# Patient Record
Sex: Female | Born: 1995 | Race: Black or African American | Hispanic: No | Marital: Married | Smoking: Never smoker
Health system: Southern US, Community
[De-identification: ages and names within clinical notes are randomized; demographics above are authoritative.]

## PROBLEM LIST (undated history)

## (undated) ENCOUNTER — Inpatient Hospital Stay (HOSPITAL_COMMUNITY): Payer: Self-pay

## (undated) DIAGNOSIS — Z789 Other specified health status: Secondary | ICD-10-CM

## (undated) HISTORY — PX: NO PAST SURGERIES: SHX2092

---
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
## 2015-06-18 ENCOUNTER — Encounter (HOSPITAL_COMMUNITY): Payer: Self-pay | Admitting: *Deleted

## 2015-06-28 ENCOUNTER — Emergency Department (HOSPITAL_COMMUNITY)
Admission: EM | Admit: 2015-06-28 | Discharge: 2015-06-28 | Disposition: A | Discharge status: Home / Self Care | Payer: Medicaid Other | Attending: Physician Assistant | Admitting: Physician Assistant

## 2015-06-28 ENCOUNTER — Encounter (HOSPITAL_COMMUNITY): Payer: Self-pay | Admitting: *Deleted

## 2015-06-28 DIAGNOSIS — R251 Tremor, unspecified: Secondary | ICD-10-CM | POA: Insufficient documentation

## 2015-06-28 DIAGNOSIS — Z3202 Encounter for pregnancy test, result negative: Secondary | ICD-10-CM | POA: Diagnosis not present

## 2015-06-28 DIAGNOSIS — R51 Headache: Secondary | ICD-10-CM | POA: Diagnosis present

## 2015-06-28 DIAGNOSIS — R519 Headache, unspecified: Secondary | ICD-10-CM

## 2015-06-28 DIAGNOSIS — R42 Dizziness and giddiness: Secondary | ICD-10-CM | POA: Diagnosis not present

## 2015-06-28 DIAGNOSIS — R5383 Other fatigue: Secondary | ICD-10-CM | POA: Insufficient documentation

## 2015-06-28 DIAGNOSIS — Z79899 Other long term (current) drug therapy: Secondary | ICD-10-CM | POA: Insufficient documentation

## 2015-06-28 DIAGNOSIS — R112 Nausea with vomiting, unspecified: Secondary | ICD-10-CM | POA: Diagnosis not present

## 2015-06-28 LAB — I-STAT BETA HCG BLOOD, ED (MC, WL, AP ONLY)

## 2015-06-28 LAB — COMPREHENSIVE METABOLIC PANEL
ALT: 15 U/L (ref 14–54)
AST: 22 U/L (ref 15–41)
Albumin: 3.9 g/dL (ref 3.5–5.0)
Alkaline Phosphatase: 76 U/L (ref 38–126)
Anion gap: 9 (ref 5–15)
BUN: 15 mg/dL (ref 6–20)
CALCIUM: 9.5 mg/dL (ref 8.9–10.3)
CO2: 25 mmol/L (ref 22–32)
CREATININE: 0.68 mg/dL (ref 0.44–1.00)
Chloride: 104 mmol/L (ref 101–111)
GFR calc non Af Amer: >60 mL/min (ref >60)
Glucose, Bld: 96 mg/dL (ref 65–99)
Potassium: 4 mmol/L (ref 3.5–5.1)
SODIUM: 138 mmol/L (ref 135–145)
Total Bilirubin: 0.6 mg/dL (ref 0.3–1.2)
Total Protein: 7.3 g/dL (ref 6.5–8.1)

## 2015-06-28 LAB — CBC
HCT: 33.1 % — ABNORMAL LOW (ref 36.0–46.0)
Hemoglobin: 12 g/dL (ref 12.0–15.0)
MCH: 25.9 pg — AB (ref 26.0–34.0)
MCHC: 36.3 g/dL — AB (ref 30.0–36.0)
MCV: 71.5 fL — ABNORMAL LOW (ref 78.0–100.0)
Platelets: 239 10*3/uL (ref 150–400)
RBC: 4.63 MIL/uL (ref 3.87–5.11)
RDW: 16.7 % — ABNORMAL HIGH (ref 11.5–15.5)
WBC: 5.9 10*3/uL (ref 4.0–10.5)

## 2015-06-28 LAB — URINALYSIS, ROUTINE W REFLEX MICROSCOPIC
BILIRUBIN URINE: NEGATIVE
Glucose, UA: NEGATIVE mg/dL
HGB URINE DIPSTICK: NEGATIVE
Ketones, ur: NEGATIVE mg/dL
Leukocytes, UA: NEGATIVE
Nitrite: NEGATIVE
PROTEIN: NEGATIVE mg/dL
Specific Gravity, Urine: 1.025 (ref 1.005–1.030)
pH: 5.5 (ref 5.0–8.0)

## 2015-06-28 LAB — LIPASE, BLOOD: Lipase: 47 U/L (ref 11–51)

## 2015-06-28 NOTE — ED Notes (Signed)
Pt reports fatigue, headache and n/v x 4 days. Denies bodyaches, fever or diarrhea. No acute distress noted at triage.

## 2015-06-28 NOTE — ED Provider Notes (Signed)
CSN: [id number]     Arrival date & time 06/28/15  1411 History  By signing my name below, I, Freida Busmaniana Omoyeni, attest that this documentation has been prepared under the direction and in the presence of non-physician practitioner, Joycie PeekBenjamin Kadyn Guild, PA-C. Electronically Signed: Freida Busmaniana Omoyeni, Scribe. 06/28/2015. 5:09 PM.    Chief Complaint  Patient presents with  . Headache  . Emesis   The history is provided by the patient. No language interpreter was used.    HPI Comments:  Dominique Tanner is a 20 y.o. female who presents to the Emergency Department complaining of  Intermittent dizziness and fatigue x 4 days. She reports associated nausea and vomiting. Pt states yesterday after breastfeeding she suddenly felt very hungry, shaky and states she felt better after eating. Pt also reports sharp intermittent HA. She notes h/o similar HA while pregnant . No alleviating factors noted; no treatments tried. G0P1. No fevers, chest pain, shortness of breath, numbness or weakness, urinary symptoms, rash. Denies any other medical symptoms or problems.  Past Medical History  Diagnosis Date  . Medical history non-contributory    Past Surgical History  Procedure Laterality Date  . No past surgeries     History reviewed. No pertinent family history. Social History  Substance Use Topics  . Smoking status: Never Smoker   . Smokeless tobacco: None  . Alcohol Use: No   OB History    Gravida Para Term Preterm AB TAB SAB Ectopic Multiple Living   1              Review of Systems  10 systems reviewed and all are negative for acute change except as noted in the HPI.   Allergies  Kiwi extract  Home Medications   Prior to Admission medications   Medication Sig Start Date End Date Taking? Authorizing Provider  Prenatal Vit-Fe Fumarate-FA (PRENATAL MULTIVITAMIN) TABS tablet Take 1 tablet by mouth daily at 12 noon.    Historical Provider, MD   BP 107/59 mmHg  Pulse 85  Temp(Src) 98.4 F (36.9 C) (Oral)   Resp 18  SpO2 97% Physical Exam  Constitutional: She is oriented to person, place, and time. She appears well-developed and well-nourished. No distress.  HENT:  Head: Normocephalic and atraumatic.  Mouth/Throat: Oropharynx is clear and moist.  Eyes: Conjunctivae are normal. Pupils are equal, round, and reactive to light. Right eye exhibits no discharge. Left eye exhibits no discharge. No scleral icterus.  Neck: Normal range of motion. Neck supple.  No meningismus or nuchal rigidity  Cardiovascular: Normal rate, regular rhythm and normal heart sounds.   Pulmonary/Chest: Effort normal and breath sounds normal. No respiratory distress. She has no wheezes. She has no rales.  Abdominal: Soft. She exhibits no distension. There is no tenderness.  Musculoskeletal: She exhibits no tenderness.  Neurological: She is alert and oriented to person, place, and time.  Cranial Nerves II-XII grossly intact. Motor strength and sensation are baseline. Moves all extremities without ataxia. Gait is baseline. Completes fine motor coordination movements without difficulty.  Skin: Skin is warm and dry. No rash noted.  Psychiatric: She has a normal mood and affect.  Nursing note and vitals reviewed.   ED Course  Procedures   DIAGNOSTIC STUDIES:  Oxygen Saturation is 97% on RA, normal by my interpretation.    COORDINATION OF CARE:  5:07 PM Pt offered pain meds at this time which she declined. Discussed treatment plan with pt at bedside and pt agreed to plan.  Labs Review Labs Reviewed  CBC - Abnormal; Notable for the following:    HCT 33.1 (*)    MCV 71.5 (*)    MCH 25.9 (*)    MCHC 36.3 (*)    RDW 16.7 (*)    All other components within normal limits  URINALYSIS, ROUTINE W REFLEX MICROSCOPIC (NOT AT [facility]) - Abnormal; Notable for the following:    APPearance CLOUDY (*)    All other components within normal limits  LIPASE, BLOOD  COMPREHENSIVE METABOLIC PANEL  I-STAT BETA HCG BLOOD, ED ([facility], [facility], [facility]  ONLY)    Imaging Review No results found. I have personally reviewed and evaluated these images and lab results as part of my medical decision-making.  Filed Vitals:   06/28/15 1426  BP: 107/59  Pulse: 85  Temp: 98.4 F (36.9 C)  TempSrc: Oral  Resp: 18  SpO2: 97%    MDM  Dominique Tanner is a 20 y.o. female who recently gave birth 3 months ago, currently breast-feeding comes in for evaluation of fatigue, headache and nausea. Patient reports she had fleeting episodes of nausea, shakiness, headache that resolved with eating. Suspect symptoms are due to Intermittent hypoglycemia as they immediately resolve after eating. She appears very well in the emergency Department, hemodynamically stable and afebrile. Completely benign physical exam. She declines any medicines or other interventional therapies. Discussed continued oral rehydration, small snacks throughout the day. Discussed strict return precautions. She verbalizes understanding and agrees with this plan and subsequent discharge. Final diagnoses:  Nonintractable headache, unspecified chronicity pattern, unspecified headache type  Other fatigue    I personally performed the services described in this documentation, which was scribed in my presence. The recorded information has been reviewed and is accurate.    Joycie Peek, PA-C 06/28/15 1915  Courteney Randall An, MD 06/29/15 1610

## 2015-06-28 NOTE — Discharge Instructions (Signed)
There does not appear to be an emergent cause for her symptoms at this time. Your exam, labs and urine are all reassuring. It is important for you to stay well hydrated, eat plenty snacks to ensure your blood sugar does not drop too low as this can be a source for your symptoms. Follow-up with your doctor as needed. Return to ED for any new or worsening symptoms.  Fatigue Fatigue is feeling tired all of the time, a lack of energy, or a lack of motivation. Occasional or mild fatigue is often a normal response to activity or life in general. However, long-lasting (chronic) or extreme fatigue may indicate an underlying medical condition. HOME CARE INSTRUCTIONS  Watch your fatigue for any changes. The following actions may help to lessen any discomfort you are feeling:  Talk to your health care provider about how much sleep you need each night. Try to get the required amount every night.  Take medicines only as directed by your health care provider.  Eat a healthy and nutritious diet. Ask your health care provider if you need help changing your diet.  Drink enough fluid to keep your urine clear or pale yellow.  Practice ways of relaxing, such as yoga, meditation, massage therapy, or acupuncture.  Exercise regularly.   Change situations that cause you stress. Try to keep your work and personal routine reasonable.  Do not abuse illegal drugs.  Limit alcohol intake to no more than 1 drink per day for nonpregnant women and 2 drinks per day for men. One drink equals 12 ounces of beer, 5 ounces of wine, or 1 ounces of hard liquor.  Take a multivitamin, if directed by your health care provider. SEEK MEDICAL CARE IF:   Your fatigue does not get better.  You have a fever.   You have unintentional weight loss or gain.  You have headaches.   You have difficulty:   Falling asleep.  Sleeping throughout the night.  You feel angry, guilty, anxious, or sad.   You are unable to have a  bowel movement (constipation).   You skin is dry.   Your legs or another part of your body is swollen.  SEEK IMMEDIATE MEDICAL CARE IF:   You feel confused.   Your vision is blurry.  You feel faint or pass out.   You have a severe headache.   You have severe abdominal, pelvic, or back pain.   You have chest pain, shortness of breath, or an irregular or fast heartbeat.   You are unable to urinate or you urinate less than normal.   You develop abnormal bleeding, such as bleeding from the rectum, vagina, nose, lungs, or nipples.  You vomit blood.   You have thoughts about harming yourself or committing suicide.   You are worried that you might harm someone else.    This information is not intended to replace advice given to you by your health care provider. Make sure you discuss any questions you have with your health care provider.   Document Released: 01/18/2007 Document Revised: 04/13/2014 Document Reviewed: 07/25/2013 Elsevier Interactive Patient Education [employer]2016 Elsevier Inc.

## 2015-08-09 ENCOUNTER — Emergency Department (HOSPITAL_COMMUNITY)
Admission: EM | Admit: 2015-08-09 | Discharge: 2015-08-09 | Disposition: A | Discharge status: Home / Self Care | Payer: Medicaid Other | Attending: Emergency Medicine | Admitting: Emergency Medicine

## 2015-08-09 ENCOUNTER — Encounter (HOSPITAL_COMMUNITY): Payer: Self-pay | Admitting: Family Medicine

## 2015-08-09 ENCOUNTER — Emergency Department (HOSPITAL_COMMUNITY): Payer: Medicaid Other

## 2015-08-09 DIAGNOSIS — R102 Pelvic and perineal pain: Secondary | ICD-10-CM

## 2015-08-09 DIAGNOSIS — N760 Acute vaginitis: Secondary | ICD-10-CM | POA: Diagnosis not present

## 2015-08-09 DIAGNOSIS — R1013 Epigastric pain: Secondary | ICD-10-CM | POA: Diagnosis present

## 2015-08-09 DIAGNOSIS — R112 Nausea with vomiting, unspecified: Secondary | ICD-10-CM | POA: Insufficient documentation

## 2015-08-09 DIAGNOSIS — Z3202 Encounter for pregnancy test, result negative: Secondary | ICD-10-CM | POA: Insufficient documentation

## 2015-08-09 DIAGNOSIS — R1030 Lower abdominal pain, unspecified: Secondary | ICD-10-CM

## 2015-08-09 DIAGNOSIS — B9689 Other specified bacterial agents as the cause of diseases classified elsewhere: Secondary | ICD-10-CM

## 2015-08-09 LAB — URINALYSIS, ROUTINE W REFLEX MICROSCOPIC
Bilirubin Urine: NEGATIVE
GLUCOSE, UA: NEGATIVE mg/dL
HGB URINE DIPSTICK: NEGATIVE
Ketones, ur: NEGATIVE mg/dL
Leukocytes, UA: NEGATIVE
Nitrite: NEGATIVE
PROTEIN: NEGATIVE mg/dL
Specific Gravity, Urine: 1.028 (ref 1.005–1.030)
pH: 6 (ref 5.0–8.0)

## 2015-08-09 LAB — CBC
HCT: 34.9 % — ABNORMAL LOW (ref 36.0–46.0)
Hemoglobin: 12.4 g/dL (ref 12.0–15.0)
MCH: 26.3 pg (ref 26.0–34.0)
MCHC: 35.5 g/dL (ref 30.0–36.0)
MCV: 73.9 fL — AB (ref 78.0–100.0)
PLATELETS: 210 10*3/uL (ref 150–400)
RBC: 4.72 MIL/uL (ref 3.87–5.11)
RDW: 14.8 % (ref 11.5–15.5)
WBC: 5.3 10*3/uL (ref 4.0–10.5)

## 2015-08-09 LAB — COMPREHENSIVE METABOLIC PANEL
ALK PHOS: 82 U/L (ref 38–126)
ALT: 17 U/L (ref 14–54)
AST: 18 U/L (ref 15–41)
Albumin: 4 g/dL (ref 3.5–5.0)
Anion gap: 9 (ref 5–15)
BUN: 10 mg/dL (ref 6–20)
CALCIUM: 9.5 mg/dL (ref 8.9–10.3)
CHLORIDE: 103 mmol/L (ref 101–111)
CO2: 26 mmol/L (ref 22–32)
CREATININE: 0.62 mg/dL (ref 0.44–1.00)
GFR calc Af Amer: >60 mL/min (ref >60)
Glucose, Bld: 82 mg/dL (ref 65–99)
Potassium: 4 mmol/L (ref 3.5–5.1)
Sodium: 138 mmol/L (ref 135–145)
Total Bilirubin: 1.4 mg/dL — ABNORMAL HIGH (ref 0.3–1.2)
Total Protein: 7.4 g/dL (ref 6.5–8.1)

## 2015-08-09 LAB — I-STAT BETA HCG BLOOD, ED (MC, WL, AP ONLY)

## 2015-08-09 LAB — WET PREP, GENITAL
Sperm: NONE SEEN
Trich, Wet Prep: NONE SEEN
YEAST WET PREP: NONE SEEN

## 2015-08-09 LAB — LIPASE, BLOOD: LIPASE: 35 U/L (ref 11–51)

## 2015-08-09 MED ORDER — DOXYCYCLINE HYCLATE 100 MG PO CAPS: 100.0000 mg | ORAL_CAPSULE | Freq: Two times a day (BID) | ORAL | Status: DC

## 2015-08-09 MED ORDER — ONDANSETRON HCL 4 MG/2ML IJ SOLN
4.0000 mg | Freq: Once | INTRAMUSCULAR | Status: AC
  Administered 2015-08-09: 4 mg via INTRAVENOUS
  Filled 2015-08-09: qty 2

## 2015-08-09 MED ORDER — METRONIDAZOLE 500 MG PO TABS: 500.0000 mg | ORAL_TABLET | Freq: Two times a day (BID) | ORAL | Status: DC

## 2015-08-09 MED ORDER — SODIUM CHLORIDE 0.9 % IV BOLUS (SEPSIS)
1000.0000 mL | Freq: Once | INTRAVENOUS | Status: AC
  Administered 2015-08-09: 1000 mL via INTRAVENOUS

## 2015-08-09 MED ORDER — CEFTRIAXONE SODIUM 250 MG IJ SOLR
250.0000 mg | Freq: Once | INTRAMUSCULAR | Status: AC
  Administered 2015-08-09: 250 mg via INTRAMUSCULAR
  Filled 2015-08-09: qty 250

## 2015-08-09 MED ORDER — IOPAMIDOL (ISOVUE-300) INJECTION 61%
INTRAVENOUS | Status: DC
Start: 2015-08-09 — End: 2015-08-09
  Filled 2015-08-09: qty 100

## 2015-08-09 MED ORDER — LIDOCAINE HCL (PF) 1 % IJ SOLN
INTRAMUSCULAR | Status: AC
  Administered 2015-08-09: 1 mL
  Filled 2015-08-09: qty 5

## 2015-08-09 MED ORDER — MORPHINE SULFATE (PF) 4 MG/ML IV SOLN
4.0000 mg | Freq: Once | INTRAVENOUS | Status: AC
  Administered 2015-08-09: 4 mg via INTRAVENOUS
  Filled 2015-08-09: qty 1

## 2015-08-09 MED ORDER — AZITHROMYCIN 250 MG PO TABS
1000.0000 mg | ORAL_TABLET | Freq: Once | ORAL | Status: AC
  Administered 2015-08-09: 1000 mg via ORAL
  Filled 2015-08-09: qty 4

## 2015-08-09 NOTE — ED Provider Notes (Signed)
3:38 PM Patient signed out to me by Ander Slade, PA-C.  Pending pelvic US.  If negative DC to home.  Plan for treatment of PID.  No elevated LFTs.  Patient is not septic.  VSS.  Non-toxic appearing.  4:21 PM [place] as below. No emergent findings.  Patient DC'd home with doxy and flagyl.  Patient instructed to use formula while on abx.  She will need to pump and discard milk.  Discussed this with the patient.  Results for orders placed or performed during the hospital encounter of 08/09/15  Wet prep, genital  Result Value Ref Range   Yeast Wet Prep HPF POC NONE SEEN NONE SEEN   Trich, Wet Prep NONE SEEN NONE SEEN   Clue Cells Wet Prep HPF POC PRESENT (A) NONE SEEN   WBC, Wet Prep HPF POC MANY (A) NONE SEEN   Sperm NONE SEEN   Lipase, blood  Result Value Ref Range   Lipase 35 11 - 51 U/L  Comprehensive metabolic panel  Result Value Ref Range   Sodium 138 135 - 145 mmol/L   Potassium 4.0 3.5 - 5.1 mmol/L   Chloride 103 101 - 111 mmol/L   CO2 26 22 - 32 mmol/L   Glucose, Bld 82 65 - 99 mg/dL   BUN 10 6 - 20 mg/dL   Creatinine, Ser 4.09 0.44 - 1.00 mg/dL   Calcium 9.5 8.9 - 81.1 mg/dL   Total Protein 7.4 6.5 - 8.1 g/dL   Albumin 4.0 3.5 - 5.0 g/dL   AST 18 15 - 41 U/L   ALT 17 14 - 54 U/L   Alkaline Phosphatase 82 38 - 126 U/L   Total Bilirubin 1.4 (H) 0.3 - 1.2 mg/dL   GFR calc non Af Amer >60 >60 mL/min   GFR calc Af Amer >60 >60 mL/min   Anion gap 9 5 - 15  CBC  Result Value Ref Range   WBC 5.3 4.0 - 10.5 K/uL   RBC 4.72 3.87 - 5.11 MIL/uL   Hemoglobin 12.4 12.0 - 15.0 g/dL   HCT 91.4 (L) 78.2 - 95.6 %   MCV 73.9 (L) 78.0 - 100.0 fL   MCH 26.3 26.0 - 34.0 pg   MCHC 35.5 30.0 - 36.0 g/dL   RDW 21.3 08.6 - 57.8 %   Platelets 210 150 - 400 K/uL  Urinalysis, Routine w reflex microscopic  Result Value Ref Range   Color, Urine YELLOW YELLOW   APPearance CLEAR CLEAR   Specific Gravity, Urine 1.028 1.005 - 1.030   pH 6.0 5.0 - 8.0   Glucose, UA NEGATIVE NEGATIVE mg/dL   Hgb urine  dipstick NEGATIVE NEGATIVE   Bilirubin Urine NEGATIVE NEGATIVE   Ketones, ur NEGATIVE NEGATIVE mg/dL   Protein, ur NEGATIVE NEGATIVE mg/dL   Nitrite NEGATIVE NEGATIVE   Leukocytes, UA NEGATIVE NEGATIVE  I-Stat beta hCG blood, ED  Result Value Ref Range   I-stat hCG, quantitative <5.0 <5 mIU/mL   Comment 3           US Transvaginal Non-ob  08/09/2015  CLINICAL DATA:  Patient with pelvic pain. EXAM: TRANSABDOMINAL AND TRANSVAGINAL ULTRASOUND OF PELVIS TECHNIQUE: Both transabdominal and transvaginal ultrasound examinations of the pelvis were performed. Transabdominal technique was performed for global imaging of the pelvis including uterus, ovaries, adnexal regions, and pelvic cul-de-sac. It was necessary to proceed with endovaginal exam following the transabdominal exam to visualize the endometrium and adnexal structures. COMPARISON:  CT abdomen pelvis 08/09/2015 FINDINGS: Uterus Measurements: 8.9 x  3.6 x 6.3 cm. No fibroids or other mass visualized. Endometrium Thickness: 7 mm.  No focal abnormality visualized. Right ovary Measurements: 2.7 x 1.5 x 2.7 cm. Normal appearance/no adnexal mass. Left ovary Measurements: 4.3 x 2.4 x 2.0 cm. Normal appearance/no adnexal mass. Other findings Small amount of free fluid. IMPRESSION: Small amount of pelvic free fluid, likely physiologic. Normal sonographic appearance of the uterus and ovaries. Electronically Signed   By: Annia Beltrew  Davis M.D.   On: 08/09/2015 16:10   [place] Pelvis Complete  08/09/2015  CLINICAL DATA:  Patient with pelvic pain. EXAM: TRANSABDOMINAL AND TRANSVAGINAL ULTRASOUND OF PELVIS TECHNIQUE: Both transabdominal and transvaginal ultrasound examinations of the pelvis were performed. Transabdominal technique was performed for global imaging of the pelvis including uterus, ovaries, adnexal regions, and pelvic cul-de-sac. It was necessary to proceed with endovaginal exam following the transabdominal exam to visualize the endometrium and adnexal structures.  COMPARISON:  CT abdomen pelvis 08/09/2015 FINDINGS: Uterus Measurements: 8.9 x 3.6 x 6.3 cm. No fibroids or other mass visualized. Endometrium Thickness: 7 mm.  No focal abnormality visualized. Right ovary Measurements: 2.7 x 1.5 x 2.7 cm. Normal appearance/no adnexal mass. Left ovary Measurements: 4.3 x 2.4 x 2.0 cm. Normal appearance/no adnexal mass. Other findings Small amount of free fluid. IMPRESSION: Small amount of pelvic free fluid, likely physiologic. Normal sonographic appearance of the uterus and ovaries. Electronically Signed   By: Annia Beltrew  Davis M.D.   On: 08/09/2015 16:10   Ct Abdomen Pelvis W Contrast  08/09/2015  CLINICAL DATA:  Right upper quadrant abdominal pain beginning yesterday. Abdominal pain and tenderness. Left lower quadrant pain. EXAM: CT ABDOMEN AND PELVIS WITH CONTRAST TECHNIQUE: Multidetector CT imaging of the abdomen and pelvis was performed using the standard protocol following bolus administration of intravenous contrast. CONTRAST:  100 mL Isovue-300 COMPARISON:  None. FINDINGS: Lower chest: The lung bases are clear without focal nodule, mass, or airspace disease. Heart size is normal. No significant pleural or pericardial effusion is present. Hepatobiliary: The liver is within normal limits. The common bile duct and gallbladder are unremarkable. Pancreas: Within normal limits. Spleen: Unremarkable. Adrenals/Urinary Tract: The adrenal glands are normal bilaterally. The kidneys and ureters are within normal limits. The urinary bladder is normal. Stomach/Bowel: The stomach and duodenum are within normal limits. The small bowel is within normal limits. The appendix is visualized and normal. The cecum and ascending colon are normal. The transverse colon is within normal limits. The descending and rectosigmoid colon are within normal limits. Vascular/Lymphatic: No significant adenopathy is present. No focal vascular lesions are present. Reproductive: The uterus is moderately edematous.  There is fluid within the endometrial canal. Moderate free fluid surrounds the uterus and layers dependently within the anatomic pelvis. The adnexa are within normal limits bilaterally. Other: A small paraumbilical hernia contains fat but no bowel. Musculoskeletal: No focal lytic or blastic lesions are present. Vertebral body heights and alignment are maintained. IMPRESSION: 1. No acute or focal lesion to explain upper abdominal pain or vomiting. 2. Moderate edema surrounding the uterus with fluid in the endometrial canal. This is nonspecific and could be related to the patient's menstrual cycle. Pelvic infection is not excluded. 3. Layering free fluid within the anatomic pelvis may be physiologic. Electronically Signed   By: Marin Robertshristopher  Mattern M.D.   On: 08/09/2015 13:31      Roxy Horsemanobert Trinidee Schrag, PA-C 08/09/15 1622

## 2015-08-09 NOTE — Discharge Instructions (Signed)
You have been seen today for abdominal pain. You still have labs pending. Any abnormal results will be called to you if they are abnormal. 1. Your wet prep showed evidence of bacterial vaginosis (BV). This is due to the overgrowth of bacteria and vagina and is not sexually transmitted disease. Your imaging showed evidence of fluid in the pelvis and, combined with the findings on the pelvic exam, give evidence for a possible pelvic infection, such as Pelvic Inflammatory Disease (PID). One of the reasons for PID is sexual transmitted infections, but this is not the only way PID can occur. Refer to the attached literature for more information. 2. Please take all of your antibiotics until finished!   You may develop abdominal discomfort or diarrhea from the antibiotic.  You may help offset this with probiotics which you can buy or get in yogurt. Do not eat or take the probiotics until 2 hours after your antibiotic. These antibiotics, Doxycycline and Flagyl, are the necessary medications needed to treat PID and Bacterial Vaginosis, respectively. You should refrain from breastfeeding while you are taking these antibiotics. You may resume breast feeding after following the complete course.  3. You should follow up with OBGYN as soon as possible. Call the number provided or go to the clinic to set  up an appointment. Follow up with PCP as needed. Return to ED should symptoms worsen.    RESOURCE GUIDE  Chronic Pain Problems: Contact [facility]  [phone] Patients need to be referred by their primary care doctor.  Insufficient Money for Medicine: Contact United Way:  call "211" or [facility] [phone].  No Primary Care Doctor: - Call [facility]  [phone] - can help you locate a primary care doctor that  accepts your insurance, provides certain services, etc. - Physician Referral [phone]  Agencies that provide inexpensive medical care: - [facility]  [phone] - [facility]  [phone] - [facility]  [phone] [facility]  [phone] - [facility]  [phone] [facility]  [phone]  Medicaid-accepting [facility] Providers: - [facility]- [address]  [phone], Mon-Fri 9am-7pm, Sat 9am-1pm - [facility]- [address] [place], [address] [place]  [phone] - [facility]- [address] [place]  [phone] [facility]- [address]  [phone] - Renaye Rakers- [address] [place], [address], [phone]  Only accepts [place] patients after they have their name  applied to their card  Self Pay (no insurance) in [place]: - Sickle Cell Patients: Dr Willey Blade, [facility]  [address] [place], [phone] - [facility]- [address] [place]  [phone]       [facility] [place]- [address], [address]       -     [facility]- see information above (Speak to [employer] if you do not have insurance)       -  [facility]- [address] [place], [phone]       -  [facility]- [address],  [phone]       -  [facility]- [address], [phone]       -  Dr Julio Sicks-  [address], [place], [phone]       -  [facility]- [address] [phone]       -  [facility]- [address], [phone], also [address], [phone]       -    [facility]- [address] [place], [phone], 1st & 3rd Saturday   every month, 10am-1pm  1) Find a Doctor and Pay Out of Pocket Although you won't have to find out who is covered by your insurance plan, it is a good idea to ask around and get recommendations. You will then need to call the office and see if the doctor you have chosen will accept you as a new patient and what types of  options they offer for patients who are self-pay. Some doctors offer discounts or will set up payment plans for their patients who do not have insurance, but you will need to ask so you aren't surprised when you get to your appointment.  2) Contact Your Local Health Department Not all health departments have doctors that can see patients for sick visits, but many do, so it is worth a call to see if yours does. If you don't know where your local health department is, you can check in your phone book. The CDC also has a tool to help you locate your state's health department, and many state websites also have listings of all of their local health departments.  3) Find a Walk-in Clinic If your illness is not likely to be very severe or complicated, you may want to try a walk in clinic. These are popping up all over the country in pharmacies, drugstores, and shopping centers. They're usually staffed by nurse practitioners or physician assistants that have been trained to treat common illnesses and complaints. They're usually fairly quick and inexpensive. However, if you have serious medical issues or chronic medical problems, these are probably not your best option  STD Testing - [facility] Department of [facility], STD Clinic, [address], [place], phone [phone] or [phone].  Monday - Friday, call for an appointment. [facility] Department of [employer], STD Clinic, [address], [place], phone [phone] or [phone].  Monday - Friday, call for an appointment.  Abuse/Neglect: [facility] Child Abuse Hotline 747-761-0364 [facility] Child Abuse Hotline (817)651-1574 (After Hours)  Emergency Shelter:  [facility] 346-212-3727  Maternity Homes: - Room at the [facility] 2896159846 - [facility] [phone]  MRSA Hotline #:   229-832-6729  [facility] Resources  Free Clinic  of [place]  [employer] [facility] Dept. [address]                 [address]         [address] [place] [address]  [place]                                               Cristobal Goldmann Phone:  [phone]  Phone:  [phone]                   Phone:  [phone]  [facility], [phone] - [facility] - [phone]- [phone]       -     [facility] in [place], [address],                                  [phone], [facility]  [place] Child Abuse Hotline 321-082-8136(336) (403) 118-4966 or 765-753-5607(336) (610) [phone] (After Hours)   Behavioral Health Services  Substance Abuse Resources: - [facility] Drug [facility]  [phone] - [facility] [phone] - [facility] [phone] Floydene Flock[facility] [phone] - Residential & Outpatient Substance Abuse Program  [phone]  Psychological Services: Tressie Ellis- [facility]  [phone] [facility]  [phone] - [facility], [phone] [place] [address], [place], ACCESS LINE: 224-752-67091-(715)[phone] or (939)[phone], [email or web address]  Dental Assistance  If unable to pay or uninsured, contact:  [facility] or [facility] to become qualified for the adult dental clinic.  Patients with Medicaid: [facility] [phone], [phone] [address], [phone]  If unable to pay, or uninsured, contact [facility] [phone]) or [facility] [phone] in [place], [phone] in [facility]) to become qualified for the adult dental clinic   Other Low-Cost Community Dental Services: - [facility]- [address], [place], [place], [phone], [phone], [address]. [address], 2nd and 4th Thursday of the month at 6:30am.  10 clients each day by appointment, can sometimes see  walk-in patients if someone does not show for an appointment. [facility]- [address], [place], [place], [phone], [phone] - [facility]- [address], [place], [place], [id number], [phone] - [facility]- [phone] [facility]- [facility]- [phone] [facility]- [facility]- [phone]

## 2015-08-09 NOTE — ED Notes (Signed)
Patient stable and ambulatory. Patient verbalized understanding of the discharge instructions.   

## 2015-08-09 NOTE — ED Provider Notes (Signed)
CSN: [id number]     Arrival date & time 08/09/15  1019 History   First MD Initiated Contact with Patient 08/09/15 1146     Chief Complaint  Patient presents with  . Abdominal Pain     (Consider location/radiation/quality/duration/timing/severity/associated sxs/prior Treatment) HPI   Dominique Tanner is a 20 y.o. female, patient with no pertinent past medical history, presenting to the ED with abdominal pain that began yesterday. Pt rates her pain at 6/10, stabbing, located in the epigastric and RUQ. Pt also endorses two episodes of vomiting, increased vaginal discharge, and subjective fever yesterday. Pt denies diarrhea/constipation, urinary complaints, or any other complaints. LMP April 25, 2015. Pt is currently breast feeding an infant born April 02, 2015, but supplements with bottle feeding. Patient had a vaginal delivery without complications.    Past Medical History  Diagnosis Date  . Medical history non-contributory    Past Surgical History  Procedure Laterality Date  . No past surgeries     History reviewed. No pertinent family history. Social History  Substance Use Topics  . Smoking status: Never Smoker   . Smokeless tobacco: None  . Alcohol Use: No   OB History    Gravida Para Term Preterm AB TAB SAB Ectopic Multiple Living   1              Review of Systems  Constitutional: Positive for fever (subjective).  Gastrointestinal: Positive for nausea, vomiting (resolved) and abdominal pain. Negative for diarrhea, constipation and blood in stool.  Genitourinary: Positive for vaginal discharge. Negative for dysuria, flank pain and difficulty urinating.  Musculoskeletal: Negative for back pain.  Skin: Negative for color change and pallor.  Neurological: Negative for dizziness and light-headedness.  All other systems reviewed and are negative.     Allergies  Kiwi extract  Home Medications   Prior to Admission medications   Medication Sig Start Date End Date Taking?  Authorizing Provider  doxycycline (VIBRAMYCIN) 100 MG capsule Take 1 capsule (100 mg total) by mouth 2 (two) times daily. 08/09/15   Shawn C Joy, PA-C  metroNIDAZOLE (FLAGYL) 500 MG tablet Take 1 tablet (500 mg total) by mouth 2 (two) times daily. 08/09/15   Shawn C Joy, PA-C   BP 102/67 mmHg  Pulse 66  Temp(Src) 98.3 F (36.8 C) (Oral)  Resp 18  Ht 5' (1.524 m)  Wt 63.05 kg  BMI 27.15 kg/m2  SpO2 98%  LMP 04/25/2015 Physical Exam  Constitutional: She appears well-developed and well-nourished. No distress.  HENT:  Head: Normocephalic and atraumatic.  Eyes: Conjunctivae are normal. Pupils are equal, round, and reactive to light.  Neck: Neck supple.  Cardiovascular: Normal rate, regular rhythm, normal heart sounds and intact distal pulses.   Pulmonary/Chest: Effort normal and breath sounds normal. No respiratory distress.  Abdominal: Soft. There is tenderness in the right upper quadrant, right lower quadrant, epigastric area, suprapubic area and left lower quadrant. There is no guarding and no CVA tenderness.  Genitourinary:  External genitalia normal Vagina with discharge - thin, mucousy discharge from the cervical os with a slight green or brown tinge. Cervix  abnormal - Dark red area surrounding the cervical os positive for cervical motion tenderness Adnexa palpated, no masses or negative for tenderness noted Bladder palpated negative for tenderness Uterus palpated no masses or positive for tenderness Otherwise normal female genitalia. RN, Missy, served as Biomedical engineer during exam.  Musculoskeletal: She exhibits no edema or tenderness.  Lymphadenopathy:    She has no cervical adenopathy.  Neurological:  She is alert.  Skin: Skin is warm and dry. She is not diaphoretic.  Psychiatric: She has a normal mood and affect. Her behavior is normal.  Nursing note and vitals reviewed.   ED Course  Pelvic exam Date/Time: 08/09/2015 2:15 PM Performed by: Anselm PancoastJOY, SHAWN C Authorized by: Harolyn RutherfordJOY, SHAWN  C Consent: Verbal consent obtained. Risks and benefits: risks, benefits and alternatives were discussed Consent given by: patient Patient understanding: patient states understanding of the procedure being performed Patient consent: the patient's understanding of the procedure matches consent given Procedure consent: procedure consent matches procedure scheduled Patient identity confirmed: verbally with patient and arm band Local anesthesia used: no Patient sedated: no Patient tolerance: Patient tolerated the procedure well with no immediate complications   (including critical care time) Labs Review Labs Reviewed  WET PREP, GENITAL - Abnormal; Notable for the following:    Clue Cells Wet Prep HPF POC PRESENT (*)    WBC, Wet Prep HPF POC MANY (*)    All other components within normal limits  COMPREHENSIVE METABOLIC PANEL - Abnormal; Notable for the following:    Total Bilirubin 1.4 (*)    All other components within normal limits  CBC - Abnormal; Notable for the following:    HCT 34.9 (*)    MCV 73.9 (*)    All other components within normal limits  LIPASE, BLOOD  URINALYSIS, ROUTINE W REFLEX MICROSCOPIC (NOT AT [facility])  RPR  HIV ANTIBODY (ROUTINE TESTING)  I-STAT BETA HCG BLOOD, ED ([facility], [facility], [facility] ONLY)  GC/CHLAMYDIA PROBE AMP ([place]) NOT AT [facility]    Imaging Review Ct Abdomen Pelvis W Contrast  08/09/2015  CLINICAL DATA:  Right upper quadrant abdominal pain beginning yesterday. Abdominal pain and tenderness. Left lower quadrant pain. EXAM: CT ABDOMEN AND PELVIS WITH CONTRAST TECHNIQUE: Multidetector CT imaging of the abdomen and pelvis was performed using the standard protocol following bolus administration of intravenous contrast. CONTRAST:  100 mL Isovue-300 COMPARISON:  None. FINDINGS: Lower chest: The lung bases are clear without focal nodule, mass, or airspace disease. Heart size is normal. No significant pleural or pericardial effusion is present. Hepatobiliary: The liver is  within normal limits. The common bile duct and gallbladder are unremarkable. Pancreas: Within normal limits. Spleen: Unremarkable. Adrenals/Urinary Tract: The adrenal glands are normal bilaterally. The kidneys and ureters are within normal limits. The urinary bladder is normal. Stomach/Bowel: The stomach and duodenum are within normal limits. The small bowel is within normal limits. The appendix is visualized and normal. The cecum and ascending colon are normal. The transverse colon is within normal limits. The descending and rectosigmoid colon are within normal limits. Vascular/Lymphatic: No significant adenopathy is present. No focal vascular lesions are present. Reproductive: The uterus is moderately edematous. There is fluid within the endometrial canal. Moderate free fluid surrounds the uterus and layers dependently within the anatomic pelvis. The adnexa are within normal limits bilaterally. Other: A small paraumbilical hernia contains fat but no bowel. Musculoskeletal: No focal lytic or blastic lesions are present. Vertebral body heights and alignment are maintained. IMPRESSION: 1. No acute or focal lesion to explain upper abdominal pain or vomiting. 2. Moderate edema surrounding the uterus with fluid in the endometrial canal. This is nonspecific and could be related to the patient's menstrual cycle. Pelvic infection is not excluded. 3. Layering free fluid within the anatomic pelvis may be physiologic. Electronically Signed   By: Marin Robertshristopher  Mattern M.D.   On: 08/09/2015 13:31   I have personally reviewed and evaluated these  images and lab results as part of my medical decision-making.   EKG Interpretation None      Medications  iopamidol (ISOVUE-300) 61 % injection (not administered)  azithromycin (ZITHROMAX) tablet 1,000 mg (not administered)  cefTRIAXone (ROCEPHIN) injection 250 mg (not administered)  sodium chloride 0.9 % bolus 1,000 mL (0 mLs Intravenous Stopped 08/09/15 1449)  morphine 4  MG/ML injection 4 mg (4 mg Intravenous Given 08/09/15 1234)  ondansetron (ZOFRAN) injection 4 mg (4 mg Intravenous Given 08/09/15 1232)   Orders Placed This Encounter  Procedures  . Pelvic exam  . Wet prep, genital  . CT Abdomen Pelvis W Contrast  . US Pelvis Complete  . US Transvaginal Non-OB  . Lipase, blood  . Comprehensive metabolic panel  . CBC  . Urinalysis, Routine w reflex microscopic  . RPR  . HIV antibody  . Diet NPO time specified  . Saline Lock IV, Maintain IV access  . Pelvic cart  . I-Stat beta hCG blood, ED     MDM   Final diagnoses:  Lower abdominal pain  Bacterial vaginosis    Dominique Tanner presents with abdominal pain, nausea, and vomiting since yesterday.  Findings and plan of care discussed with Derwood Kaplan, MD.   Patient's presentation is suspicious for possible STD versus abdominal infection. BV apparent on wet prep. Abdominal CT ordered initially over a pelvic US due to the wide spread nature of the patient's pain. Patient is nontoxic appearing, afebrile, not tachycardic, not tachypneic, maintains SPO2 of 98% on room air, and is in no apparent distress. Patient has no signs of sepsis or other serious or life-threatening condition. Upon reassessment, patient states that her pain and nausea have subsided. Patient found to have cervical motion tenderness on exam as well as lower midline pelvic tenderness. Patient states that the pain moved from mostly the upper abdomen to just the lower abdomen during her time in the ED. This, combined with the CMT on exam, gives indication for ABX therapy and further investigation with ultrasound. 4:04 PM End of shift patient care handoff report given to [employer], PA-C.  Plan: Review pelvic US and if no additional abnormalities that would prevent discharge, discharge the patient with treatment for PID and BV (prescriptions and discharge paperwork already completed) and have patient follow up with OBGYN   Filed Vitals:    08/09/15 1037 08/09/15 1449  BP: 103/66 102/67  Pulse: 68 66  Temp: 98.3 F (36.8 C)   TempSrc: Oral   Resp: 18 18  Height: 5' (1.524 m)   Weight: 63.05 kg   SpO2: 98% 98%     Anselm Pancoast, PA-C 08/09/15 1608  Derwood Kaplan, MD 08/10/15 1210

## 2015-08-09 NOTE — ED Notes (Signed)
Pt here for abd pain more in the RUQ with vomiting started yesterday.

## 2015-08-10 LAB — RPR: RPR Ser Ql: NONREACTIVE

## 2015-08-10 LAB — HIV ANTIBODY (ROUTINE TESTING W REFLEX): HIV SCREEN 4TH GENERATION: NONREACTIVE

## 2015-08-12 LAB — GC/CHLAMYDIA PROBE AMP (~~LOC~~) NOT AT ARMC
CHLAMYDIA, DNA PROBE: NEGATIVE
Neisseria Gonorrhea: NEGATIVE

## 2015-08-26 ENCOUNTER — Telehealth: Payer: Self-pay | Admitting: General Practice

## 2015-08-26 NOTE — Telephone Encounter (Signed)
Patient called and left message stating she is having issues with an appt. Called patient and she states she was told she had an appt but then when she called to verify the time she was told she didn't have an appt so she guesses she needs to make another one. Per chart review, see no evidence of appt or notes about an appt. Patient states she would like to be seen for a check up & birth control. Told patient someone from the front office will contact her to schedule that appt and she should use condoms in the mean time for birth control. Told patient it will be at least a month before we can get her an appt. Patient verbalized understanding & had no questions

## 2015-11-04 ENCOUNTER — Emergency Department (HOSPITAL_COMMUNITY): Payer: Medicaid Other

## 2015-11-04 ENCOUNTER — Emergency Department (HOSPITAL_COMMUNITY)
Admission: EM | Admit: 2015-11-04 | Discharge: 2015-11-04 | Disposition: A | Discharge status: Home / Self Care | Payer: Medicaid Other | Attending: Emergency Medicine | Admitting: Emergency Medicine

## 2015-11-04 ENCOUNTER — Encounter (HOSPITAL_COMMUNITY): Payer: Self-pay | Admitting: Emergency Medicine

## 2015-11-04 DIAGNOSIS — R109 Unspecified abdominal pain: Secondary | ICD-10-CM

## 2015-11-04 DIAGNOSIS — N898 Other specified noninflammatory disorders of vagina: Secondary | ICD-10-CM | POA: Insufficient documentation

## 2015-11-04 DIAGNOSIS — O26891 Other specified pregnancy related conditions, first trimester: Secondary | ICD-10-CM | POA: Insufficient documentation

## 2015-11-04 DIAGNOSIS — R1031 Right lower quadrant pain: Secondary | ICD-10-CM | POA: Diagnosis not present

## 2015-11-04 DIAGNOSIS — R1032 Left lower quadrant pain: Secondary | ICD-10-CM | POA: Diagnosis not present

## 2015-11-04 DIAGNOSIS — Z3A01 Less than 8 weeks gestation of pregnancy: Secondary | ICD-10-CM | POA: Insufficient documentation

## 2015-11-04 DIAGNOSIS — Z3201 Encounter for pregnancy test, result positive: Secondary | ICD-10-CM

## 2015-11-04 DIAGNOSIS — O26899 Other specified pregnancy related conditions, unspecified trimester: Secondary | ICD-10-CM

## 2015-11-04 LAB — COMPREHENSIVE METABOLIC PANEL
ALT: 14 U/L (ref 14–54)
AST: 17 U/L (ref 15–41)
Albumin: 4.1 g/dL (ref 3.5–5.0)
Alkaline Phosphatase: 61 U/L (ref 38–126)
Anion gap: 6 (ref 5–15)
BUN: 10 mg/dL (ref 6–20)
CALCIUM: 9 mg/dL (ref 8.9–10.3)
CHLORIDE: 108 mmol/L (ref 101–111)
CO2: 21 mmol/L — ABNORMAL LOW (ref 22–32)
CREATININE: 0.64 mg/dL (ref 0.44–1.00)
Glucose, Bld: 86 mg/dL (ref 65–99)
Potassium: 3.7 mmol/L (ref 3.5–5.1)
Sodium: 135 mmol/L (ref 135–145)
Total Bilirubin: 1.1 mg/dL (ref 0.3–1.2)
Total Protein: 7 g/dL (ref 6.5–8.1)

## 2015-11-04 LAB — CBC
HCT: 32.6 % — ABNORMAL LOW (ref 36.0–46.0)
Hemoglobin: 11.7 g/dL — ABNORMAL LOW (ref 12.0–15.0)
MCH: 27.6 pg (ref 26.0–34.0)
MCHC: 35.9 g/dL (ref 30.0–36.0)
MCV: 76.9 fL — ABNORMAL LOW (ref 78.0–100.0)
PLATELETS: 187 10*3/uL (ref 150–400)
RBC: 4.24 MIL/uL (ref 3.87–5.11)
RDW: 12.8 % (ref 11.5–15.5)
WBC: 6.2 10*3/uL (ref 4.0–10.5)

## 2015-11-04 LAB — HCG, QUANTITATIVE, PREGNANCY: hCG, Beta Chain, Quant, S: 5737 m[IU]/mL — ABNORMAL HIGH (ref <5)

## 2015-11-04 LAB — URINALYSIS, ROUTINE W REFLEX MICROSCOPIC
Bilirubin Urine: NEGATIVE
Glucose, UA: NEGATIVE mg/dL
Hgb urine dipstick: NEGATIVE
Ketones, ur: NEGATIVE mg/dL
LEUKOCYTES UA: NEGATIVE
NITRITE: NEGATIVE
PROTEIN: NEGATIVE mg/dL
Specific Gravity, Urine: >1.03 — ABNORMAL HIGH (ref 1.005–1.030)
pH: 6 (ref 5.0–8.0)

## 2015-11-04 LAB — WET PREP, GENITAL
CLUE CELLS WET PREP: NONE SEEN
Sperm: NONE SEEN
TRICH WET PREP: NONE SEEN
YEAST WET PREP: NONE SEEN

## 2015-11-04 LAB — POC URINE PREG, ED: PREG TEST UR: POSITIVE — AB

## 2015-11-04 LAB — LIPASE, BLOOD: Lipase: 31 U/L (ref 11–51)

## 2015-11-04 MED ORDER — SODIUM CHLORIDE 0.9 % IV BOLUS (SEPSIS)
1000.0000 mL | Freq: Once | INTRAVENOUS | Status: AC
  Administered 2015-11-04: 1000 mL via INTRAVENOUS

## 2015-11-04 NOTE — ED Provider Notes (Signed)
[facility] DEPT Provider Note   CSN: [id number] Arrival date & time: 11/04/15  7471  First Provider Contact:  First MD Initiated Contact with Patient 11/04/15 1010      History   Chief Complaint Chief Complaint  Patient presents with  . Abdominal Pain    HPI Dominique Tanner is a 20 y.o. female.  Dominique Tanner is a 20 y.o. female with no pertinent PMH with one prior SVD who presents to the Emergency Department complaining of intermittent bilateral lower abdominal pain described as cramping which will occasionally radiate to epigastrium x 3 weeks. Associated symptoms include nausea, fatigue, mild back pain. Seen in ED in May for "vaginal infection" - took all ABX as directed with much improvement in vaginal discharge. Small amount of thin white discharge now which Dominique Tanner states is very different from prior visit. Denies vomiting, dysuria, chest pain, shortness of breath, vaginal bleeding. LMP approx. 15/16th of June.      The history is provided by the patient and medical records. No language interpreter was used.  Abdominal Pain   Associated symptoms include nausea and constipation. Pertinent negatives include fever, diarrhea, vomiting, dysuria, frequency and headaches.    Past Medical History:  Diagnosis Date  . Medical history non-contributory     There are no active problems to display for this patient.   Past Surgical History:  Procedure Laterality Date  . NO PAST SURGERIES      OB History    Gravida Para Term Preterm AB Living   2             SAB TAB Ectopic Multiple Live Births                   Home Medications    Prior to Admission medications   Not on File    Family History No family history on file.  Social History Social History  Substance Use Topics  . Smoking status: Never Smoker  . Smokeless tobacco: Never Used  . Alcohol use No     Allergies   Kiwi extract   Review of Systems Review of Systems  Constitutional: Positive for fatigue.  Negative for fever.  HENT: Negative for congestion.   Eyes: Negative for visual disturbance.  Respiratory: Negative for cough and shortness of breath.   Cardiovascular: Negative.   Gastrointestinal: Positive for abdominal pain, constipation and nausea. Negative for diarrhea and vomiting.  Genitourinary: Positive for vaginal discharge. Negative for dysuria, frequency, urgency and vaginal bleeding.  Musculoskeletal: Positive for back pain. Negative for neck pain.  Skin: Negative for rash.  Neurological: Negative for headaches.     Physical Exam Updated Vital Signs BP 101/60   Pulse 80   Temp 98.3 F (36.8 C) (Oral)   Resp 16   Ht 5' (1.524 m)   Wt 68 kg   LMP 09/20/2015   SpO2 99%   Breastfeeding? No   BMI 29.29 kg/m   Physical Exam  Constitutional: Dominique Tanner is oriented to person, place, and time. Dominique Tanner appears well-developed and well-nourished. No distress.  HENT:  Head: Normocephalic and atraumatic.  Cardiovascular: Normal rate, regular rhythm and normal heart sounds.  Exam reveals no gallop and no friction rub.   No murmur heard. Pulmonary/Chest: Effort normal and breath sounds normal. No respiratory distress. Dominique Tanner has no wheezes. Dominique Tanner has no rales. Dominique Tanner exhibits no tenderness.  Abdominal: Soft. Bowel sounds are normal. Dominique Tanner exhibits no distension. There is tenderness (Bilateral lower abdomen. ).  Genitourinary:  Genitourinary Comments: Chaperone  present for exam. + mild white vaginal discharge. No rashes, lesions, or tenderness to external genitalia. No erythema, injury, or tenderness to vaginal mucosa. No bleeding within vaginal vault. No CMT. Closed cervical os.   Musculoskeletal: Dominique Tanner exhibits no edema.  Neurological: Dominique Tanner is alert and oriented to person, place, and time.  Skin: Skin is warm and dry.  Nursing note and vitals reviewed.    ED Treatments / Results  Labs (all labs ordered are listed, but only abnormal results are displayed) Labs Reviewed  WET PREP, GENITAL -  Abnormal; Notable for the following:       Result Value   WBC, Wet Prep HPF POC MANY (*)    All other components within normal limits  COMPREHENSIVE METABOLIC PANEL - Abnormal; Notable for the following:    CO2 21 (*)    All other components within normal limits  CBC - Abnormal; Notable for the following:    Hemoglobin 11.7 (*)    HCT 32.6 (*)    MCV 76.9 (*)    All other components within normal limits  URINALYSIS, ROUTINE W REFLEX MICROSCOPIC (NOT AT [facility]) - Abnormal; Notable for the following:    Specific Gravity, Urine >1.030 (*)    All other components within normal limits  HCG, QUANTITATIVE, PREGNANCY - Abnormal; Notable for the following:    hCG, Beta Chain, Quant, S 5,737 (*)    All other components within normal limits  POC URINE PREG, ED - Abnormal; Notable for the following:    Preg Test, Ur POSITIVE (*)    All other components within normal limits  LIPASE, BLOOD  GC/CHLAMYDIA PROBE AMP ([place]) NOT AT [facility]    EKG  EKG Interpretation None       Radiology US Ob Comp Less 14 Wks  Result Date: 11/04/2015 CLINICAL DATA:  Pregnant patient with lower abdominal pain for 3 weeks. Quantitative HCG 5,737 EXAM: OBSTETRIC <14 WK [place] AND TRANSVAGINAL OB US TECHNIQUE: Both transabdominal and transvaginal ultrasound examinations were performed for complete evaluation of the gestation as well as the maternal uterus, adnexal regions, and pelvic cul-de-sac. Transvaginal technique was performed to assess early pregnancy. COMPARISON:  None. FINDINGS: Intrauterine gestational sac: Visualized. Yolk sac:  Not visualized. Embryo:  Not visualized. Cardiac Activity: Not applicable. MSD: 6.6  mm   5 w   3  d Subchorionic hemorrhage:  None visualized. Maternal uterus/adnexae: Corpus luteum cyst on the right is noted. Trace amount of free pelvic fluid is seen. IMPRESSION: Probable early intrauterine gestational sac, but no yolk sac, fetal pole, or cardiac activity yet visualized. Recommend  follow-up quantitative B-HCG levels and follow-up US in 14 days to confirm and assess viability. This recommendation follows SRU consensus guidelines: Diagnostic Criteria for Nonviable Pregnancy Early in the First Trimester. Malva Limes Med 2013; 161:0960-45. Electronically Signed   By: Drusilla Kanner M.D.   On: 11/04/2015 14:15   US Ob Transvaginal  Result Date: 11/04/2015 CLINICAL DATA:  Pregnant patient with lower abdominal pain for 3 weeks. Quantitative HCG 5,737 EXAM: OBSTETRIC <14 WK [place] AND TRANSVAGINAL OB US TECHNIQUE: Both transabdominal and transvaginal ultrasound examinations were performed for complete evaluation of the gestation as well as the maternal uterus, adnexal regions, and pelvic cul-de-sac. Transvaginal technique was performed to assess early pregnancy. COMPARISON:  None. FINDINGS: Intrauterine gestational sac: Visualized. Yolk sac:  Not visualized. Embryo:  Not visualized. Cardiac Activity: Not applicable. MSD: 6.6  mm   5 w   3  d Subchorionic hemorrhage:  None visualized. Maternal uterus/adnexae: Corpus luteum cyst on the right is noted. Trace amount of free pelvic fluid is seen. IMPRESSION: Probable early intrauterine gestational sac, but no yolk sac, fetal pole, or cardiac activity yet visualized. Recommend follow-up quantitative B-HCG levels and follow-up US in 14 days to confirm and assess viability. This recommendation follows SRU consensus guidelines: Diagnostic Criteria for Nonviable Pregnancy Early in the First Trimester. Malva Limes Med 2013; 782:9562-13. Electronically Signed   By: Drusilla Kanner M.D.   On: 11/04/2015 14:15    Procedures Procedures (including critical care time)  Medications Ordered in ED Medications  sodium chloride 0.9 % bolus 1,000 mL (0 mLs Intravenous Stopped 11/04/15 1536)     Initial Impression / Assessment and Plan / ED Course  I have reviewed the triage vital signs and the nursing notes.  Pertinent labs & imaging results that were available  during my care of the patient were reviewed by me and considered in my medical decision making (see chart for details).  Clinical Course   Iridessa Harrow presents to ED for lower abdominal pain described as cramping x 3 weeks.  + pregnancy test.   All labs reviewed, UA with elevated sp gravity - no signs of infxn - fluids given.  hcg 5737 - Ultrasound obtained which shows probably early intrauterine gestational sac, but no yolk sac or fetal pole. Radiology recommends repeat US in 14 days. Case discussed with attending, Dr. Dalene Seltzer. Recommended patient follow up with OBGYN in 2 days for repeat hcg. Informed her of need for [place] in 2 weeks or per OBGYN. Reasons to return to ED discussed. Follow up care again discussed and patient expresses understanding. All questions answered.   Patient discussed with Dr. Dalene Seltzer who agrees with treatment plan.   Final Clinical Impressions(s) / ED Diagnoses   Final diagnoses:  Abdominal pain during pregnancy  Positive pregnancy test    New Prescriptions There are no discharge medications for this patient.    [facility] Daniesha Driver, PA-C 11/04/15 1737    Alvira Monday, MD 11/08/15 1151

## 2015-11-04 NOTE — Discharge Instructions (Signed)
You need to follow up with your OBGYN on Wednesday (in two days) for a repeat hcg test. Please let them know that you had an ultrasound today and it is recommended to have a repeat ultrasound in two weeks as well. Return to ER for worsening pain, vaginal bleeding, new or worsening, any additional concerns. If for some reason you cannot get in to see the OBGYN on Wednesday, you can also return to ER.

## 2015-11-04 NOTE — ED Notes (Signed)
Pt d/c home. DC instructions reviewed with pt. Pt ambulatory.

## 2015-11-05 LAB — GC/CHLAMYDIA PROBE AMP (~~LOC~~) NOT AT ARMC
Chlamydia: NEGATIVE
NEISSERIA GONORRHEA: NEGATIVE

## 2015-12-22 ENCOUNTER — Emergency Department (HOSPITAL_COMMUNITY)
Admission: EM | Admit: 2015-12-22 | Discharge: 2015-12-22 | Disposition: A | Discharge status: Home / Self Care | Payer: BLUE CROSS/BLUE SHIELD | Attending: Emergency Medicine | Admitting: Emergency Medicine

## 2015-12-22 ENCOUNTER — Encounter (HOSPITAL_COMMUNITY): Payer: Self-pay

## 2015-12-22 DIAGNOSIS — R102 Pelvic and perineal pain: Secondary | ICD-10-CM

## 2015-12-22 DIAGNOSIS — K5900 Constipation, unspecified: Secondary | ICD-10-CM | POA: Insufficient documentation

## 2015-12-22 LAB — URINALYSIS, ROUTINE W REFLEX MICROSCOPIC
BILIRUBIN URINE: NEGATIVE
GLUCOSE, UA: NEGATIVE mg/dL
Hgb urine dipstick: NEGATIVE
KETONES UR: NEGATIVE mg/dL
LEUKOCYTES UA: NEGATIVE
NITRITE: NEGATIVE
PH: 6.5 (ref 5.0–8.0)
Protein, ur: NEGATIVE mg/dL
SPECIFIC GRAVITY, URINE: 1.018 (ref 1.005–1.030)

## 2015-12-22 MED ORDER — DOCUSATE SODIUM 100 MG PO CAPS
100.0000 mg | ORAL_CAPSULE | Freq: Once | ORAL | Status: AC
  Administered 2015-12-22: 100 mg via ORAL
  Filled 2015-12-22: qty 1

## 2015-12-22 MED ORDER — DOCUSATE SODIUM 100 MG PO CAPS: 100.0000 mg | ORAL_CAPSULE | Freq: Every day | ORAL | 0 refills | Status: DC

## 2015-12-22 MED ORDER — ACETAMINOPHEN 500 MG PO TABS
1000.0000 mg | ORAL_TABLET | Freq: Once | ORAL | Status: AC
  Administered 2015-12-22: 1000 mg via ORAL
  Filled 2015-12-22: qty 2

## 2015-12-22 NOTE — ED Triage Notes (Signed)
Per Pt, Pt is coming from home with complaints of abdominal cramping that started last night. Pt is pregnant with unknown due date. Denies vaginal discharge of bleeding. Denies N/V/D

## 2015-12-22 NOTE — ED Provider Notes (Signed)
I saw and evaluated the patient, reviewed the resident's note and I agree with the findings and plan.  Pertinent History: The patient is a 20 year old female who is approximately [redacted] weeks pregnant by dates as well as by an ultrasound obtained at her OB/GYN office. She presents with right pelvic pain and suprapubic pain with urinary frequency and constipation.  Pertinent Exam findings: On exam the patient has no tenderness on exam, she is very well-appearing, she has a very soft nontender abdomen, bedside ultrasound does reveal an intrauterine pregnancy with a normal-appearing fetus with normal heart tones.  Doubt preg related, has known cyst on R ovary, well appaering, doubt appy  UA pending  I personally interpreted the EKG as well as the resident and agree with the interpretation on the resident's chart.  Final diagnoses:  Pelvic pain in female  Constipation, unspecified constipation type      Eber HongBrian Normalee Sistare, MD 12/23/15 1645

## 2015-12-22 NOTE — ED Notes (Signed)
Spoke with pt regarding wait time and advised her she was next to go back by time.

## 2015-12-22 NOTE — ED Notes (Signed)
Patient comes in with abdominal pain that started last night. Patient is [redacted] wks pregnant with hx of left ovarian cyst. Patient states the pain is intermittent cramping that moves around in her abdomen. She also states she has had constipation during pregnancy. BMs every three days of hard, small stool.

## 2015-12-22 NOTE — ED Notes (Signed)
MD at bedside. 

## 2015-12-22 NOTE — ED Provider Notes (Signed)
[facility]-EMERGENCY DEPT Provider Note   CSN: [id number] Arrival date & time: 12/22/15  1013   History   Chief Complaint Chief Complaint  Patient presents with  . Abdominal Pain    HPI Dominique Tanner is a 20 y.o. female.  The history is provided by the patient and medical records.   20 year old female G2 P1 at about [redacted] weeks GA presenting with abdominal pain. Onset was last night. Waxing and waning since. Moderate severity. Located in bilateral lower quadrants of pelvis. Radiating slightly around back. Described as cramping and squeezing. Has not tried anything to treat it. Denies associated vaginal discharge, bleeding, dysuria, fevers. She has been very constipated and straining to have bowel movements. She has not felt baby move yet. She has been getting routine prenatal care and has her next OB appt tomorrow. She has had an ultrasound confirming an IUP. Her prior pregnancy was delivered at term via NSVD with no reported complications.    Past Medical History:  Diagnosis Date  . Medical history non-contributory     There are no active problems to display for this patient.   Past Surgical History:  Procedure Laterality Date  . NO PAST SURGERIES      OB History    Gravida Para Term Preterm AB Living   2             SAB TAB Ectopic Multiple Live Births                   Home Medications    Prior to Admission medications   Medication Sig Start Date End Date Taking? Authorizing Provider  docusate sodium (COLACE) 100 MG capsule Take 1 capsule (100 mg total) by mouth daily. 12/22/15   Urban GibsonJenny Harce Volden, MD    Family History No family history on file.  Social History Social History  Substance Use Topics  . Smoking status: Never Smoker  . Smokeless tobacco: Never Used  . Alcohol use No     Allergies   Kiwi extract   Review of Systems Review of Systems  Constitutional: Negative for chills and fever.  Respiratory: Negative for cough and shortness of breath.     Cardiovascular: Negative for chest pain.  Gastrointestinal: Positive for abdominal pain and constipation. Negative for diarrhea and vomiting.  Genitourinary: Positive for frequency and pelvic pain. Negative for dysuria, vaginal bleeding and vaginal discharge.  Musculoskeletal: Positive for back pain.  All other systems reviewed and are negative.   Physical Exam Updated Vital Signs BP 95/63 (BP Location: Left Arm)   Pulse 80   Temp 98.6 F (37 C) (Oral)   Resp 16   Ht 5' (1.524 m)   Wt 68 kg   LMP 09/20/2015   SpO2 100%   BMI 29.29 kg/m   Physical Exam  Constitutional: She appears well-developed and well-nourished. No distress.  HENT:  Head: Normocephalic and atraumatic.  Eyes: Conjunctivae are normal.  Neck: Neck supple.  Cardiovascular: Normal rate and regular rhythm.   No murmur heard. Pulmonary/Chest: Effort normal and breath sounds normal. No respiratory distress.  Abdominal: Soft. There is tenderness (LLQ and RLQ, suprapubic area, with no r/g, neg rosvings. no CVAT).  Musculoskeletal: She exhibits no edema.  Neurological: She is alert.  Skin: Skin is warm and dry.  Psychiatric: She has a normal mood and affect.  Nursing note and vitals reviewed.    ED Treatments / Results  Labs (all labs ordered are listed, but only abnormal results are displayed) Labs Reviewed  URINALYSIS, ROUTINE W REFLEX MICROSCOPIC (NOT AT [facility])    EKG  EKG Interpretation None       Radiology No results found.  Procedures Procedures (including critical care time)  Medications Ordered in ED Medications  acetaminophen (TYLENOL) tablet 1,000 mg (1,000 mg Oral Given 12/22/15 1617)  docusate sodium (COLACE) capsule 100 mg (100 mg Oral Given 12/22/15 1617)     Initial Impression / Assessment and Plan / ED Course  I have reviewed the triage vital signs and the nursing notes.  Pertinent labs & imaging results that were available during my care of the patient were reviewed by me  and considered in my medical decision making (see chart for details).  Clinical Course    20 year old female G2 P1 at about [redacted] weeks GA presenting with abdominal pain, as above. She is afebrile with stable vitals- likely isolated reading of elevated BP at 146/124 is erroneous as this was not seen on initial vitals or on rechecks after being bedding. She has mild TTP on exam in bilat lower quadrants without peritonitis for my exam. UA negative for UTI. Offered patient pelvic exam but she declined as she denies any discharge, bleeding, or concern for STI. I feel this is reasonable. Bedside US by Dr Hyacinth Meeker again confirms IUP with normal heart tones. Patient advised to use tylenol PRN, start a stool softener for her constipation. She has follow up already scheduled per routine OB appt tomorrow. Discharged in stable condition.   Case discussed with Dr. Hyacinth Meeker who oversaw management of this patient.    Final Clinical Impressions(s) / ED Diagnoses   Final diagnoses:  Pelvic pain in female  Constipation, unspecified constipation type    New Prescriptions Discharge Medication List as of 12/22/2015  5:35 PM    START taking these medications   Details  docusate sodium (COLACE) 100 MG capsule Take 1 capsule (100 mg total) by mouth daily., Starting Sun 12/22/2015, Print         Urban Gibson, MD 12/22/15 1610    Eber Hong, MD 12/23/15 [phone]

## 2015-12-23 LAB — OB RESULTS CONSOLE GBS: GBS: NEGATIVE

## 2015-12-23 LAB — OB RESULTS CONSOLE RUBELLA ANTIBODY, IGM: RUBELLA: IMMUNE

## 2015-12-23 LAB — OB RESULTS CONSOLE GC/CHLAMYDIA
CHLAMYDIA, DNA PROBE: NEGATIVE
Gonorrhea: NEGATIVE

## 2015-12-23 LAB — OB RESULTS CONSOLE RPR: RPR: NONREACTIVE

## 2015-12-23 LAB — OB RESULTS CONSOLE HEPATITIS B SURFACE ANTIGEN: HEP B S AG: NEGATIVE

## 2015-12-23 LAB — OB RESULTS CONSOLE HIV ANTIBODY (ROUTINE TESTING): HIV: NONREACTIVE

## 2016-01-08 ENCOUNTER — Emergency Department (HOSPITAL_COMMUNITY)
Admission: EM | Admit: 2016-01-08 | Discharge: 2016-01-08 | Disposition: A | Discharge status: Home / Self Care | Payer: BLUE CROSS/BLUE SHIELD | Source: Home / Self Care

## 2016-01-08 ENCOUNTER — Encounter (HOSPITAL_COMMUNITY): Payer: Self-pay | Admitting: *Deleted

## 2016-01-08 ENCOUNTER — Inpatient Hospital Stay (HOSPITAL_COMMUNITY)
Admission: AD | Admit: 2016-01-08 | Discharge: 2016-01-09 | Disposition: A | Discharge status: Home / Self Care | Payer: BLUE CROSS/BLUE SHIELD | Source: Ambulatory Visit | Attending: Obstetrics and Gynecology | Admitting: Obstetrics and Gynecology

## 2016-01-08 DIAGNOSIS — R42 Dizziness and giddiness: Secondary | ICD-10-CM | POA: Insufficient documentation

## 2016-01-08 DIAGNOSIS — R109 Unspecified abdominal pain: Secondary | ICD-10-CM | POA: Insufficient documentation

## 2016-01-08 DIAGNOSIS — O26892 Other specified pregnancy related conditions, second trimester: Secondary | ICD-10-CM | POA: Diagnosis not present

## 2016-01-08 DIAGNOSIS — Z3A14 14 weeks gestation of pregnancy: Secondary | ICD-10-CM | POA: Diagnosis not present

## 2016-01-08 DIAGNOSIS — W182XXA Fall in (into) shower or empty bathtub, initial encounter: Secondary | ICD-10-CM | POA: Diagnosis not present

## 2016-01-08 DIAGNOSIS — Z3A15 15 weeks gestation of pregnancy: Secondary | ICD-10-CM | POA: Insufficient documentation

## 2016-01-08 DIAGNOSIS — Z5321 Procedure and treatment not carried out due to patient leaving prior to being seen by health care provider: Secondary | ICD-10-CM

## 2016-01-08 DIAGNOSIS — S3991XA Unspecified injury of abdomen, initial encounter: Secondary | ICD-10-CM | POA: Diagnosis not present

## 2016-01-08 DIAGNOSIS — W19XXXA Unspecified fall, initial encounter: Secondary | ICD-10-CM

## 2016-01-08 DIAGNOSIS — O9A211 Injury, poisoning and certain other consequences of external causes complicating pregnancy, first trimester: Secondary | ICD-10-CM | POA: Diagnosis not present

## 2016-01-08 LAB — I-STAT BETA HCG BLOOD, ED (MC, WL, AP ONLY): I-stat hCG, quantitative: >2000 m[IU]/mL — ABNORMAL HIGH (ref <5)

## 2016-01-08 LAB — URINALYSIS, ROUTINE W REFLEX MICROSCOPIC
Bilirubin Urine: NEGATIVE
Glucose, UA: NEGATIVE mg/dL
Hgb urine dipstick: NEGATIVE
KETONES UR: NEGATIVE mg/dL
LEUKOCYTES UA: NEGATIVE
Nitrite: NEGATIVE
PROTEIN: NEGATIVE mg/dL
Specific Gravity, Urine: 1.041 — ABNORMAL HIGH (ref 1.005–1.030)
pH: 6.5 (ref 5.0–8.0)

## 2016-01-08 LAB — COMPREHENSIVE METABOLIC PANEL
ALT: 10 U/L — AB (ref 14–54)
AST: 14 U/L — AB (ref 15–41)
Albumin: 3.4 g/dL — ABNORMAL LOW (ref 3.5–5.0)
Alkaline Phosphatase: 51 U/L (ref 38–126)
Anion gap: 5 (ref 5–15)
BUN: 9 mg/dL (ref 6–20)
CHLORIDE: 109 mmol/L (ref 101–111)
CO2: 20 mmol/L — AB (ref 22–32)
Calcium: 9.1 mg/dL (ref 8.9–10.3)
Creatinine, Ser: 0.6 mg/dL (ref 0.44–1.00)
Glucose, Bld: 89 mg/dL (ref 65–99)
POTASSIUM: 3.9 mmol/L (ref 3.5–5.1)
SODIUM: 134 mmol/L — AB (ref 135–145)
Total Bilirubin: 0.7 mg/dL (ref 0.3–1.2)
Total Protein: 6.4 g/dL — ABNORMAL LOW (ref 6.5–8.1)

## 2016-01-08 LAB — CBC
HEMATOCRIT: 30.4 % — AB (ref 36.0–46.0)
Hemoglobin: 11 g/dL — ABNORMAL LOW (ref 12.0–15.0)
MCH: 27.7 pg (ref 26.0–34.0)
MCHC: 36.2 g/dL — ABNORMAL HIGH (ref 30.0–36.0)
MCV: 76.6 fL — AB (ref 78.0–100.0)
Platelets: 196 10*3/uL (ref 150–400)
RBC: 3.97 MIL/uL (ref 3.87–5.11)
RDW: 13.4 % (ref 11.5–15.5)
WBC: 7 10*3/uL (ref 4.0–10.5)

## 2016-01-08 LAB — LIPASE, BLOOD: LIPASE: 72 U/L — AB (ref 11–51)

## 2016-01-08 NOTE — ED Triage Notes (Signed)
The pt reports that she is  [redacted] weeks pregnant and she has been dizzy with n and v since yesterday  She fell in the bathtub earlier  Today   And now she has abd pain with upper and lower back pains..  She cannot remember her last period.  This is her second child.  [facility]  She had some vaginal bleeding this am before she fell

## 2016-01-08 NOTE — MAU Note (Signed)
Pt states that she was not feeling well and felt dizzy in the shower and got out and states that she fell getting out of the shower around 8pm. States that she hit the side of her stomach and back. [facility] and waited 2 hours so she left. States that she had some spotting this morning when wiping and some lower abdominal cramping. Rates 8/10. Has not taken anything for pain. States last intercourse was 3 days ago.

## 2016-01-08 NOTE — ED Triage Notes (Signed)
Pt doesn't want to wait any longer. [facility]

## 2016-01-09 ENCOUNTER — Encounter (HOSPITAL_COMMUNITY): Payer: Self-pay

## 2016-01-09 DIAGNOSIS — Z3A14 14 weeks gestation of pregnancy: Secondary | ICD-10-CM | POA: Diagnosis not present

## 2016-01-09 DIAGNOSIS — S3991XA Unspecified injury of abdomen, initial encounter: Secondary | ICD-10-CM | POA: Diagnosis not present

## 2016-01-09 DIAGNOSIS — W182XXA Fall in (into) shower or empty bathtub, initial encounter: Secondary | ICD-10-CM

## 2016-01-09 DIAGNOSIS — O26892 Other specified pregnancy related conditions, second trimester: Secondary | ICD-10-CM | POA: Diagnosis not present

## 2016-01-09 DIAGNOSIS — O9A211 Injury, poisoning and certain other consequences of external causes complicating pregnancy, first trimester: Secondary | ICD-10-CM

## 2016-01-09 MED ORDER — ACETAMINOPHEN 500 MG PO TABS
1000.0000 mg | ORAL_TABLET | Freq: Once | ORAL | Status: AC
  Administered 2016-01-09: 1000 mg via ORAL
  Filled 2016-01-09: qty 2

## 2016-01-09 NOTE — Discharge Instructions (Signed)

## 2016-01-09 NOTE — MAU Provider Note (Signed)
History     CSN: [id number]  Arrival date and time: 01/08/16 2246   First Provider Initiated Contact with Patient 01/09/16 0252      Chief Complaint  Patient presents with  . Fall  . Abdominal Pain   Dominique Tanner is a 20 y.o. G2P0 at 7248w6d who presents today after a fall. She states that around 2000 on 01/08/16 she fell when she got out of the shower. She states that she "did not feel well at work", and then when she was in the shower she fetl dizzy. She fell when she stepped out of the shower. She reports abdominal pain at this time. She denies any LOC. She states that she had some vaginal spotting yesterday prior to the fall, but none since.    Fall  The accident occurred 3 to 6 hours ago. Fall occurred: while stepping out of a tub.  She fell from a height of 3 to 5 ft. She landed on hard floor. The point of impact was the buttocks. The symptoms are aggravated by ambulation. Associated symptoms include abdominal pain. Pertinent negatives include no fever, loss of consciousness, nausea or vomiting. She has tried nothing for the symptoms.    Past Medical History:  Diagnosis Date  . Medical history non-contributory     Past Surgical History:  Procedure Laterality Date  . NO PAST SURGERIES      No family history on file.  Social History  Substance Use Topics  . Smoking status: Never Smoker  . Smokeless tobacco: Never Used  . Alcohol use No    Allergies:  Allergies  Allergen Reactions  . Kiwi Extract Anaphylaxis and Itching    Prescriptions Prior to Admission  Medication Sig Dispense Refill Last Dose  . docusate sodium (COLACE) 100 MG capsule Take 1 capsule (100 mg total) by mouth daily. 30 capsule 0     Review of Systems  Constitutional: Negative for chills and fever.  Gastrointestinal: Positive for abdominal pain. Negative for nausea and vomiting.  Neurological: Positive for dizziness. Negative for loss of consciousness.   Physical Exam   Blood pressure (!)  95/52, pulse 80, temperature 98.6 F (37 C), temperature source Oral, resp. rate 18, height 5' (1.524 m), weight 146 lb (66.2 kg), last menstrual period 09/20/2015, SpO2 100 %.  Physical Exam  Nursing note and vitals reviewed. Constitutional: She is oriented to person, place, and time. She appears well-developed and well-nourished. No distress.  HENT:  Head: Normocephalic.  Cardiovascular: Normal rate.   Respiratory: Effort normal.  GI: Soft. There is no tenderness. There is no rebound.  Genitourinary:  Genitourinary Comments:  Cervix: closed/thick Uterus: AGA, FHT 145 with doppler   Musculoskeletal: Normal range of motion.  Neurological: She is alert and oriented to person, place, and time.  Skin: Skin is warm and dry.  Psychiatric: She has a normal mood and affect.     Results for orders placed or performed during the hospital encounter of 01/08/16 (from the past 24 hour(s))  Urinalysis, Routine w reflex microscopic     Status: Abnormal   Collection Time: 01/08/16  9:12 PM  Result Value Ref Range   Color, Urine AMBER (A) YELLOW   APPearance CLOUDY (A) CLEAR   Specific Gravity, Urine 1.041 (H) 1.005 - 1.030   pH 6.5 5.0 - 8.0   Glucose, UA NEGATIVE NEGATIVE mg/dL   Hgb urine dipstick NEGATIVE NEGATIVE   Bilirubin Urine NEGATIVE NEGATIVE   Ketones, ur NEGATIVE NEGATIVE mg/dL   Protein, ur  NEGATIVE NEGATIVE mg/dL   Nitrite NEGATIVE NEGATIVE   Leukocytes, UA NEGATIVE NEGATIVE  Lipase, blood     Status: Abnormal   Collection Time: 01/08/16  9:20 PM  Result Value Ref Range   Lipase 72 (H) 11 - 51 U/L  Comprehensive metabolic panel     Status: Abnormal   Collection Time: 01/08/16  9:20 PM  Result Value Ref Range   Sodium 134 (L) 135 - 145 mmol/L   Potassium 3.9 3.5 - 5.1 mmol/L   Chloride 109 101 - 111 mmol/L   CO2 20 (L) 22 - 32 mmol/L   Glucose, Bld 89 65 - 99 mg/dL   BUN 9 6 - 20 mg/dL   Creatinine, Ser 6.96 0.44 - 1.00 mg/dL   Calcium 9.1 8.9 - 29.5 mg/dL   Total  Protein 6.4 (L) 6.5 - 8.1 g/dL   Albumin 3.4 (L) 3.5 - 5.0 g/dL   AST 14 (L) 15 - 41 U/L   ALT 10 (L) 14 - 54 U/L   Alkaline Phosphatase 51 38 - 126 U/L   Total Bilirubin 0.7 0.3 - 1.2 mg/dL   GFR calc non Af Amer >60 >60 mL/min   GFR calc Af Amer >60 >60 mL/min   Anion gap 5 5 - 15  CBC     Status: Abnormal   Collection Time: 01/08/16  9:20 PM  Result Value Ref Range   WBC 7.0 4.0 - 10.5 K/uL   RBC 3.97 3.87 - 5.11 MIL/uL   Hemoglobin 11.0 (L) 12.0 - 15.0 g/dL   HCT 28.4 (L) 13.2 - 44.0 %   MCV 76.6 (L) 78.0 - 100.0 fL   MCH 27.7 26.0 - 34.0 pg   MCHC 36.2 (H) 30.0 - 36.0 g/dL   RDW 10.2 72.5 - 36.6 %   Platelets 196 150 - 400 K/uL  I-Stat beta hCG blood, ED     Status: Abnormal   Collection Time: 01/08/16  9:35 PM  Result Value Ref Range   I-stat hCG, quantitative >2,000.0 (H) <5 mIU/mL   Comment 3             MAU Course  Procedures  MDM Patient given tylenol for pain here.   Assessment and Plan   1. Fall, initial encounter   2. [redacted] weeks gestation of pregnancy    DC home Comfort measures reviewed  2ndTrimester precautions  Bleeding precautions RX: none, OTC tylenol recommended  Return to MAU as needed FU with OB as planned  Follow-up Information    [facility] .   Contact information: [address] [place] [address] [phone]            Tawnya Crook 01/09/2016, 2:53 AM

## 2016-02-11 ENCOUNTER — Encounter (HOSPITAL_COMMUNITY): Payer: Self-pay | Admitting: *Deleted

## 2016-02-11 ENCOUNTER — Inpatient Hospital Stay (HOSPITAL_COMMUNITY): Payer: BLUE CROSS/BLUE SHIELD

## 2016-02-11 ENCOUNTER — Inpatient Hospital Stay (HOSPITAL_COMMUNITY)
Admission: AD | Admit: 2016-02-11 | Discharge: 2016-02-12 | Disposition: A | Discharge status: Home / Self Care | Payer: BLUE CROSS/BLUE SHIELD | Source: Ambulatory Visit | Attending: Obstetrics and Gynecology | Admitting: Obstetrics and Gynecology

## 2016-02-11 DIAGNOSIS — O26892 Other specified pregnancy related conditions, second trimester: Secondary | ICD-10-CM | POA: Diagnosis not present

## 2016-02-11 DIAGNOSIS — Z3A19 19 weeks gestation of pregnancy: Secondary | ICD-10-CM

## 2016-02-11 DIAGNOSIS — R109 Unspecified abdominal pain: Secondary | ICD-10-CM | POA: Diagnosis not present

## 2016-02-11 DIAGNOSIS — N949 Unspecified condition associated with female genital organs and menstrual cycle: Secondary | ICD-10-CM

## 2016-02-11 DIAGNOSIS — O26899 Other specified pregnancy related conditions, unspecified trimester: Secondary | ICD-10-CM

## 2016-02-11 LAB — URINALYSIS, ROUTINE W REFLEX MICROSCOPIC
Bilirubin Urine: NEGATIVE
Glucose, UA: 250 mg/dL — AB
Hgb urine dipstick: NEGATIVE
Ketones, ur: 15 mg/dL — AB
Leukocytes, UA: NEGATIVE
NITRITE: NEGATIVE
Protein, ur: NEGATIVE mg/dL
SPECIFIC GRAVITY, URINE: 1.02 (ref 1.005–1.030)
pH: 6.5 (ref 5.0–8.0)

## 2016-02-11 LAB — CBC WITH DIFFERENTIAL/PLATELET
BASOS ABS: 0 10*3/uL (ref 0.0–0.1)
BASOS PCT: 0 %
EOS PCT: 1 %
Eosinophils Absolute: 0.1 10*3/uL (ref 0.0–0.7)
HCT: 28.1 % — ABNORMAL LOW (ref 36.0–46.0)
Hemoglobin: 10.6 g/dL — ABNORMAL LOW (ref 12.0–15.0)
Lymphocytes Relative: 37 %
Lymphs Abs: 3.1 10*3/uL (ref 0.7–4.0)
MCH: 28.5 pg (ref 26.0–34.0)
MCHC: 37.7 g/dL — ABNORMAL HIGH (ref 30.0–36.0)
MCV: 75.5 fL — AB (ref 78.0–100.0)
MONO ABS: 0.4 10*3/uL (ref 0.1–1.0)
Monocytes Relative: 5 %
Neutro Abs: 4.9 10*3/uL (ref 1.7–7.7)
Neutrophils Relative %: 57 %
PLATELETS: 197 10*3/uL (ref 150–400)
RBC: 3.72 MIL/uL — ABNORMAL LOW (ref 3.87–5.11)
RDW: 13 % (ref 11.5–15.5)
WBC: 8.6 10*3/uL (ref 4.0–10.5)

## 2016-02-11 LAB — WET PREP, GENITAL
Clue Cells Wet Prep HPF POC: NONE SEEN
SPERM: NONE SEEN
TRICH WET PREP: NONE SEEN
YEAST WET PREP: NONE SEEN

## 2016-02-11 MED ORDER — IBUPROFEN 600 MG PO TABS
600.0000 mg | ORAL_TABLET | Freq: Once | ORAL | Status: AC
  Administered 2016-02-11: 600 mg via ORAL
  Filled 2016-02-11: qty 1

## 2016-02-11 NOTE — MAU Note (Signed)
PT  SAYS   SHE HAS  LOWER  LOWER ABD  PAIN  - STARTED  AT 830PM. WHILE  SHE WAS  IN  CHURCH.     SAYS HER BACK HURTS- FEELS  LIKE SOMETHING  IS  CRUSHING  HER   BONES.   GETS [facility] - WITH  DR  Claiborne BillingsALLAHAN-   LAST SEEN-    10-16.        NEXT APPOINTMENT -  11-20.     LAST SEX-  11-1.

## 2016-02-11 NOTE — MAU Provider Note (Signed)
Chief Complaint: No chief complaint on file.   First Provider Initiated Contact with Patient 02/11/16 2303     SUBJECTIVE HPI: Dominique Tanner is a 20 y.o. G2P0 at [redacted]w[redacted]d who presents to [facility] Admissions reporting mid and low abd pain and low back pain since 2030 tonight. Started when she was at church.   Location: mid and low abd pain Quality: twisting and pulling Severity: 8/10 on pain scale Duration: <3 hours Context: none Timing: intermittent Modifying factors: none. Hasn't tried anything for the pain Associated signs and symptoms: Neg for fever, chills, N/V/D/C, urinary complaints, VB, LOF or vaginal discharge  Location: low back pain Quality: "like something is crushing her bones Severity: 8/10 on pain scale Duration: <3 hours Context: none Timing: Constant Modifying factors: none. Hasn't tried anything for the pain. Can't find a comfortable positions Associated signs and symptoms: Neg for fever, chills, N/V/D/C, urinary complaints, VB, LOF or vaginal discharge  Past Medical History:  Diagnosis Date  . Medical history non-contributory    OB History  Gravida Para Term Preterm AB Living  2         1  SAB TAB Ectopic Multiple Live Births               # Outcome Date GA Lbr Len/2nd Weight Sex Delivery Anes PTL Lv  2 Current           1 Gravida      Vag-Spont        Past Surgical History:  Procedure Laterality Date  . NO PAST SURGERIES     Social History   Social History  . Marital status: Single    Spouse name: N/A  . Number of children: N/A  . Years of education: N/A   Occupational History  . Not on file.   Social History Main Topics  . Smoking status: Never Smoker  . Smokeless tobacco: Never Used  . Alcohol use No  . Drug use: No  . Sexual activity: Not Currently    Birth control/ protection: None   Other Topics Concern  . Not on file   Social History Narrative  . No narrative on file   History reviewed. No pertinent family history. No current  facility-administered medications on file prior to encounter.    Current Outpatient Prescriptions on File Prior to Encounter  Medication Sig Dispense Refill  . docusate sodium (COLACE) 100 MG capsule Take 1 capsule (100 mg total) by mouth daily. 30 capsule 0   Allergies  Allergen Reactions  . Kiwi Extract Anaphylaxis and Itching    I have reviewed patient's Past Medical Hx, Surgical Hx, Family Hx, Social Hx, medications and allergies.   Review of Systems  Constitutional: Negative for appetite change, chills and fever.  Gastrointestinal: Positive for abdominal pain. Negative for abdominal distention, blood in stool, constipation, diarrhea, nausea and vomiting.  Genitourinary: Negative for difficulty urinating, dysuria, flank pain, frequency, hematuria, pelvic pain, urgency, vaginal bleeding (small amount of lochia), vaginal discharge and vaginal pain.  Musculoskeletal: Positive for back pain. Negative for neck pain.    OBJECTIVE Patient Vitals for the past 24 hrs:  BP Temp Temp src Pulse Resp Height Weight  02/11/16 2155 98/55 98.8 F (37.1 C) Oral 83 20 5' (1.524 m) 150 lb 12 oz (68.4 kg)   Constitutional: Well-developed, well-nourished female in mild-mod distress intermittently.  Cardiovascular: normal rate Respiratory: normal rate and effort.  GI: Abd soft, non-tender, gravid appropriate for gestational age. Hyperactive BS x 4 MS: Extremities nontender,  no edema, normal ROM. Low back mildly TTP. Neurologic: Alert and oriented x 4.  GU: Neg CVAT.  SPECULUM EXAM: NEFG, physiologic discharge, no blood noted, cervix clean  BIMANUAL: cervix long and closed; uterus 18-week size, no adnexal tenderness or masses. No CMT.  FHR 147 by doppler.   LAB RESULTS Results for orders placed or performed during the hospital encounter of 02/11/16 (from the past 24 hour(s))  Urinalysis, Routine w reflex microscopic (not at [facility])     Status: Abnormal   Collection Time: 02/11/16  9:56 PM  Result  Value Ref Range   Color, Urine YELLOW YELLOW   APPearance CLEAR CLEAR   Specific Gravity, Urine 1.020 1.005 - 1.030   pH 6.5 5.0 - 8.0   Glucose, UA 250 (A) NEGATIVE mg/dL   Hgb urine dipstick NEGATIVE NEGATIVE   Bilirubin Urine NEGATIVE NEGATIVE   Ketones, ur 15 (A) NEGATIVE mg/dL   Protein, ur NEGATIVE NEGATIVE mg/dL   Nitrite NEGATIVE NEGATIVE   Leukocytes, UA NEGATIVE NEGATIVE  CBC with Differential/Platelet     Status: Abnormal   Collection Time: 02/11/16 10:30 PM  Result Value Ref Range   WBC 8.6 4.0 - 10.5 K/uL   RBC 3.72 (L) 3.87 - 5.11 MIL/uL   Hemoglobin 10.6 (L) 12.0 - 15.0 g/dL   HCT 91.428.1 (L) 78.236.0 - 95.646.0 %   MCV 75.5 (L) 78.0 - 100.0 fL   MCH 28.5 26.0 - 34.0 pg   MCHC 37.7 (H) 30.0 - 36.0 g/dL   RDW 21.313.0 08.611.5 - 57.815.5 %   Platelets 197 150 - 400 K/uL   Neutrophils Relative % 57 %   Neutro Abs 4.9 1.7 - 7.7 K/uL   Lymphocytes Relative 37 %   Lymphs Abs 3.1 0.7 - 4.0 K/uL   Monocytes Relative 5 %   Monocytes Absolute 0.4 0.1 - 1.0 K/uL   Eosinophils Relative 1 %   Eosinophils Absolute 0.1 0.0 - 0.7 K/uL   Basophils Relative 0 %   Basophils Absolute 0.0 0.0 - 0.1 K/uL  Wet prep, genital     Status: Abnormal   Collection Time: 02/11/16 10:45 PM  Result Value Ref Range   Yeast Wet Prep HPF POC NONE SEEN NONE SEEN   Trich, Wet Prep NONE SEEN NONE SEEN   Clue Cells Wet Prep HPF POC NONE SEEN NONE SEEN   WBC, Wet Prep HPF POC MANY (A) NONE SEEN   Sperm NONE SEEN     IMAGING CL 4 cm, no adnexal masses  MAU COURSE Orders Placed This Encounter  Procedures  . Wet prep, genital  . US MFM OB LIMITED  . Urinalysis, Routine w reflex microscopic (not at [facility])  . CBC with Differential/Platelet  . Lab instructions   Meds ordered this encounter  Medications  . ibuprofen (ADVIL,MOTRIN) tablet 600 mg   Care of pt turned over to Thressa ShellerHeather Hogan, CNM at [phone].   Lakeland[place]ia Smith, CNM 02/12/2016  12:21 AM   Preliminary US: WNL, cervical length 4cm, no cyst seen Patient  is more comfortable after having ibuprofen  0038: D/W Dr. Tenny Crawoss, ok for DC home.   Assessment/Plan   1. Round ligament pain   2. Abdominal pain affecting pregnancy, antepartum   3. [redacted] weeks gestation of pregnancy    DC home Comfort measures reviewed  2nd Trimester precautions  PTL precautions  Fetal kick counts RX: none Return to MAU as needed FU with OB as planned  Follow-up Information    Almon HerculesOSS,KENDRA H., MD Follow  up.   Specialty:  Obstetrics and Gynecology Contact information: [address] [place] [address] [phone]

## 2016-02-12 DIAGNOSIS — Z3A19 19 weeks gestation of pregnancy: Secondary | ICD-10-CM

## 2016-02-12 DIAGNOSIS — R109 Unspecified abdominal pain: Secondary | ICD-10-CM | POA: Diagnosis not present

## 2016-02-12 DIAGNOSIS — O26892 Other specified pregnancy related conditions, second trimester: Secondary | ICD-10-CM | POA: Diagnosis not present

## 2016-02-12 LAB — GC/CHLAMYDIA PROBE AMP (~~LOC~~) NOT AT ARMC
Chlamydia: NEGATIVE
Neisseria Gonorrhea: NEGATIVE

## 2016-02-12 NOTE — Discharge Instructions (Signed)

## 2016-04-06 NOTE — L&D Delivery Note (Signed)
Pt was admitted in labor. She was transferred to L&D. She had an amniotomy with clear fluid. She rapidly completed the first stage. She pushed for 5 min. The baby rotated from OP to OA and she then delivered on live viable black infant over an intact perineum. Placenta-S/I. Baby to NBN. EBL-400cc. No tears.

## 2016-04-10 ENCOUNTER — Encounter (HOSPITAL_COMMUNITY): Payer: Self-pay

## 2016-04-10 ENCOUNTER — Inpatient Hospital Stay (HOSPITAL_COMMUNITY)
Admission: AD | Admit: 2016-04-10 | Discharge: 2016-04-10 | Disposition: A | Discharge status: Home / Self Care | Payer: BLUE CROSS/BLUE SHIELD | Source: Ambulatory Visit | Attending: Obstetrics and Gynecology | Admitting: Obstetrics and Gynecology

## 2016-04-10 ENCOUNTER — Inpatient Hospital Stay (HOSPITAL_COMMUNITY): Payer: BLUE CROSS/BLUE SHIELD

## 2016-04-10 DIAGNOSIS — O9989 Other specified diseases and conditions complicating pregnancy, childbirth and the puerperium: Secondary | ICD-10-CM | POA: Diagnosis not present

## 2016-04-10 DIAGNOSIS — Z3A28 28 weeks gestation of pregnancy: Secondary | ICD-10-CM

## 2016-04-10 DIAGNOSIS — O4703 False labor before 37 completed weeks of gestation, third trimester: Secondary | ICD-10-CM

## 2016-04-10 LAB — URINALYSIS, ROUTINE W REFLEX MICROSCOPIC
BILIRUBIN URINE: NEGATIVE
GLUCOSE, UA: NEGATIVE mg/dL
HGB URINE DIPSTICK: NEGATIVE
KETONES UR: NEGATIVE mg/dL
LEUKOCYTES UA: NEGATIVE
NITRITE: NEGATIVE
PH: 5 (ref 5.0–8.0)
PROTEIN: 30 mg/dL — AB
Specific Gravity, Urine: 1.026 (ref 1.005–1.030)

## 2016-04-10 NOTE — MAU Note (Signed)
Has been having ctx's off and on, yesterday they were pretty intense.  Regular check up today, cervix was soft. Sent over for further eval. .not contracting like she was.

## 2016-04-10 NOTE — MAU Provider Note (Signed)
History     CSN: [id number]  Arrival date and time: 04/10/16 1216   First Provider Initiated Contact with Patient 04/10/16 1301      Chief Complaint  Patient presents with  . Contractions   HPI Dominique Tanner is a 21 y.o. G2P1001 at 41102w0d who presents with contractions. Reports painful contractions yesterday. Had >4 per hour. Resolved today; has only felt 1 ctx in the last 2-3 hours. Was in office this morning for routine OB; was checked by Dr. Henderson CloudHorvath, 1 cm & soft, sent here for further evaluation. Denies LOF, vaginal bleeding, or recent intercourse. No pain now. Positive fetal movement.   OB History    Gravida Para Term Preterm AB Living   2 1 1     1    SAB TAB Ectopic Multiple Live Births           1      Past Medical History:  Diagnosis Date  . Medical history non-contributory     Past Surgical History:  Procedure Laterality Date  . NO PAST SURGERIES      History reviewed. No pertinent family history.  Social History  Substance Use Topics  . Smoking status: Never Smoker  . Smokeless tobacco: Never Used  . Alcohol use No    Allergies:  Allergies  Allergen Reactions  . Kiwi Extract Anaphylaxis and Itching    Prescriptions Prior to Admission  Medication Sig Dispense Refill Last Dose  . docusate sodium (COLACE) 100 MG capsule Take 1 capsule (100 mg total) by mouth daily. 30 capsule 0 Past Month at Unknown time  . Prenatal Vit-Fe Fumarate-FA (PRENATAL MULTIVITAMIN) TABS tablet Take 1 tablet by mouth daily at 12 noon.   02/11/2016 at Unknown time    Review of Systems  Constitutional: Negative.   Gastrointestinal: Negative.   Genitourinary: Negative.    Physical Exam   Blood pressure 102/60, pulse 87, temperature 98.2 F (36.8 C), temperature source Oral, resp. rate 18, weight 154 lb 12 oz (70.2 kg), last menstrual period 09/20/2015, not currently breastfeeding.  Physical Exam  Nursing note and vitals reviewed. Constitutional: She is oriented to person, place,  and time. She appears well-developed and well-nourished. No distress.  HENT:  Head: Normocephalic and atraumatic.  Eyes: Conjunctivae are normal. Right eye exhibits no discharge. Left eye exhibits no discharge. No scleral icterus.  Neck: Normal range of motion.  Cardiovascular: Normal rate, regular rhythm and normal heart sounds.   No murmur heard. Respiratory: Effort normal and breath sounds normal. No respiratory distress. She has no wheezes.  GI: Soft. There is no tenderness.  Neurological: She is alert and oriented to person, place, and time.  Skin: Skin is warm and dry. She is not diaphoretic.  Psychiatric: She has a normal mood and affect. Her behavior is normal. Judgment and thought content normal.   Dilation: 1 Effacement (%): Thick Exam by:: E Tabita Corbo RN  Fetal Tracing:  Baseline: 145 Variability: moderate Accelerations: 10x10 Decelerations: few small varriables  Toco: irr ctx that resolved with PO fluids  MAU Course  Procedures Results for orders placed or performed during the hospital encounter of 04/10/16 (from the past 24 hour(s))  Urinalysis, Routine w reflex microscopic     Status: Abnormal   Collection Time: 04/10/16 12:35 PM  Result Value Ref Range   Color, Urine YELLOW YELLOW   APPearance HAZY (A) CLEAR   Specific Gravity, Urine 1.026 1.005 - 1.030   pH 5.0 5.0 - 8.0   Glucose, UA NEGATIVE NEGATIVE  mg/dL   Hgb urine dipstick NEGATIVE NEGATIVE   Bilirubin Urine NEGATIVE NEGATIVE   Ketones, ur NEGATIVE NEGATIVE mg/dL   Protein, ur 30 (A) NEGATIVE mg/dL   Nitrite NEGATIVE NEGATIVE   Leukocytes, UA NEGATIVE NEGATIVE   RBC / HPF 0-5 0 - 5 RBC/hpf   WBC, UA 0-5 0 - 5 WBC/hpf   Bacteria, UA RARE (A) NONE SEEN   Squamous Epithelial / LPF 0-5 (A) NONE SEEN   Mucous PRESENT     MDM FHT appropriate for gestation Unable to obtain FFN d/t recent SVE Pt not feeling ctx here & SVE unchanged from office visit this morning S/w Dr. Tenny Craw -- check cervical length;  if normal will d/c home CL 3.7 by TVUS  Assessment and Plan  A: 1. [redacted] weeks gestation of pregnancy   2. Preterm uterine contractions in third trimester, antepartum    P: Discharge home Preterm labor precautions -- call office and/or return to MAU Keep f/u with OB  Judeth Horn 04/10/2016, 12:59 PM

## 2016-04-10 NOTE — Discharge Instructions (Signed)
. °Preterm Labor and Birth Information °The normal length of a pregnancy is 39-41 weeks. Preterm labor is when labor starts before 37 completed weeks of pregnancy. °What are the risk factors for preterm labor? °Preterm labor is more likely to occur in women who: °· Have certain infections during pregnancy such as a bladder infection, sexually transmitted infection, or infection inside the uterus (chorioamnionitis). °· Have a shorter-than-normal cervix. °· Have gone into preterm labor before. °· Have had surgery on their cervix. °· Are younger than age 17 or older than age 35. °· Are African American. °· Are pregnant with twins or multiple babies (multiple gestation). °· Take street drugs or smoke while pregnant. °· Do not gain enough weight while pregnant. °· Became pregnant shortly after having been pregnant. °What are the symptoms of preterm labor? °Symptoms of preterm labor include: °· Cramps similar to those that can happen during a menstrual period. The cramps may happen with diarrhea. °· Pain in the abdomen or lower back. °· Regular uterine contractions that may feel like tightening of the abdomen. °· A feeling of increased pressure in the pelvis. °· Increased watery or bloody mucus discharge from the vagina. °· Water breaking (ruptured amniotic sac). °Why is it important to recognize signs of preterm labor? °It is important to recognize signs of preterm labor because babies who are born prematurely may not be fully developed. This can put them at an increased risk for: °· Long-term (chronic) heart and lung problems. °· Difficulty immediately after birth with regulating body systems, including blood sugar, body temperature, heart rate, and breathing rate. °· Bleeding in the brain. °· Cerebral palsy. °· Learning difficulties. °· Death. °These risks are highest for babies who are born before 34 weeks of pregnancy. °How is preterm labor treated? °Treatment depends on the length of your pregnancy, your condition,  and the health of your baby. It may involve: °· Having a stitch (suture) placed in your cervix to prevent your cervix from opening too early (cerclage). °· Taking or being given medicines, such as: °¨ Hormone medicines. These may be given early in pregnancy to help support the pregnancy. °¨ Medicine to stop contractions. °¨ Medicines to help mature the baby’s lungs. These may be prescribed if the risk of delivery is high. °¨ Medicines to prevent your baby from developing cerebral palsy. °If the labor happens before 34 weeks of pregnancy, you may need to stay in the hospital. °What should I do if I think I am in preterm labor? °If you think that you are going into preterm labor, call your health care provider right away. °How can I prevent preterm labor in future pregnancies? °To increase your chance of having a full-term pregnancy: °· Do not use any tobacco products, such as cigarettes, chewing tobacco, and e-cigarettes. If you need help quitting, ask your health care provider. °· Do not use street drugs or medicines that have not been prescribed to you during your pregnancy. °· Talk with your health care provider before taking any herbal supplements, even if you have been taking them regularly. °· Make sure you gain a healthy amount of weight during your pregnancy. °· Watch for infection. If you think that you might have an infection, get it checked right away. °· Make sure to tell your health care provider if you have gone into preterm labor before. °This information is not intended to replace advice given to you by your health care provider. Make sure you discuss any questions you have with your   health care provider. °Document Released: 06/13/2003 Document Revised: 09/03/2015 Document Reviewed: 08/14/2015 °Elsevier Interactive Patient Education © 2017 [employer] ° °

## 2016-05-08 ENCOUNTER — Observation Stay (HOSPITAL_COMMUNITY)
Admission: AD | Admit: 2016-05-08 | Discharge: 2016-05-10 | Disposition: A | Discharge status: Home / Self Care | Payer: BLUE CROSS/BLUE SHIELD | Source: Ambulatory Visit | Attending: Obstetrics and Gynecology | Admitting: Obstetrics and Gynecology

## 2016-05-08 ENCOUNTER — Encounter (HOSPITAL_COMMUNITY): Payer: Self-pay | Admitting: *Deleted

## 2016-05-08 DIAGNOSIS — O4703 False labor before 37 completed weeks of gestation, third trimester: Principal | ICD-10-CM | POA: Diagnosis present

## 2016-05-08 DIAGNOSIS — Z3A32 32 weeks gestation of pregnancy: Secondary | ICD-10-CM | POA: Insufficient documentation

## 2016-05-08 DIAGNOSIS — O26893 Other specified pregnancy related conditions, third trimester: Secondary | ICD-10-CM | POA: Diagnosis present

## 2016-05-08 LAB — WET PREP, GENITAL
Clue Cells Wet Prep HPF POC: NONE SEEN
SPERM: NONE SEEN
Trich, Wet Prep: NONE SEEN
YEAST WET PREP: NONE SEEN

## 2016-05-08 LAB — URINALYSIS, ROUTINE W REFLEX MICROSCOPIC
BILIRUBIN URINE: NEGATIVE
Glucose, UA: NEGATIVE mg/dL
Hgb urine dipstick: NEGATIVE
KETONES UR: NEGATIVE mg/dL
LEUKOCYTES UA: NEGATIVE
NITRITE: NEGATIVE
PROTEIN: NEGATIVE mg/dL
Specific Gravity, Urine: 1.025 (ref 1.005–1.030)
pH: 6.5 (ref 5.0–8.0)

## 2016-05-08 LAB — COMPREHENSIVE METABOLIC PANEL
ALK PHOS: 87 U/L (ref 38–126)
ALT: 10 U/L — AB (ref 14–54)
ANION GAP: 9 (ref 5–15)
AST: 15 U/L (ref 15–41)
Albumin: 3.1 g/dL — ABNORMAL LOW (ref 3.5–5.0)
BILIRUBIN TOTAL: 0.7 mg/dL (ref 0.3–1.2)
BUN: 8 mg/dL (ref 6–20)
CALCIUM: 8.5 mg/dL — AB (ref 8.9–10.3)
CO2: 21 mmol/L — ABNORMAL LOW (ref 22–32)
CREATININE: 0.62 mg/dL (ref 0.44–1.00)
Chloride: 104 mmol/L (ref 101–111)
Glucose, Bld: 85 mg/dL (ref 65–99)
Potassium: 3.6 mmol/L (ref 3.5–5.1)
Sodium: 134 mmol/L — ABNORMAL LOW (ref 135–145)
TOTAL PROTEIN: 7.1 g/dL (ref 6.5–8.1)

## 2016-05-08 LAB — CBC
HEMATOCRIT: 28.7 % — AB (ref 36.0–46.0)
Hemoglobin: 10.5 g/dL — ABNORMAL LOW (ref 12.0–15.0)
MCH: 26.9 pg (ref 26.0–34.0)
MCHC: 36.6 g/dL — AB (ref 30.0–36.0)
MCV: 73.4 fL — AB (ref 78.0–100.0)
PLATELETS: 219 10*3/uL (ref 150–400)
RBC: 3.91 MIL/uL (ref 3.87–5.11)
RDW: 14.1 % (ref 11.5–15.5)
WBC: 8.1 10*3/uL (ref 4.0–10.5)

## 2016-05-08 LAB — TYPE AND SCREEN
ABO/RH(D): A POS
Antibody Screen: NEGATIVE

## 2016-05-08 LAB — LIPASE, BLOOD: Lipase: 32 U/L (ref 11–51)

## 2016-05-08 LAB — AMYLASE: Amylase: 97 U/L (ref 28–100)

## 2016-05-08 LAB — ABO/RH: ABO/RH(D): A POS

## 2016-05-08 LAB — FETAL FIBRONECTIN: Fetal Fibronectin: NEGATIVE

## 2016-05-08 MED ORDER — BETAMETHASONE SOD PHOS & ACET 6 (3-3) MG/ML IJ SUSP
12.0000 mg | INTRAMUSCULAR | Status: AC
  Administered 2016-05-08 – 2016-05-09 (×2): 12 mg via INTRAMUSCULAR
  Filled 2016-05-08: qty 2

## 2016-05-08 MED ORDER — ZOLPIDEM TARTRATE 5 MG PO TABS
5.0000 mg | ORAL_TABLET | Freq: Every evening | ORAL | Status: DC | PRN
  Administered 2016-05-09: 5 mg via ORAL
  Filled 2016-05-08 (×2): qty 1

## 2016-05-08 MED ORDER — LACTATED RINGERS IV SOLN
INTRAVENOUS | Status: DC
  Administered 2016-05-08 – 2016-05-10 (×4): via INTRAVENOUS

## 2016-05-08 MED ORDER — BETAMETHASONE SOD PHOS & ACET 6 (3-3) MG/ML IJ SUSP
12.0000 mg | Freq: Once | INTRAMUSCULAR | Status: DC
  Filled 2016-05-08: qty 2

## 2016-05-08 MED ORDER — ACETAMINOPHEN 325 MG PO TABS: 650.0000 mg | ORAL_TABLET | ORAL | Status: DC | PRN

## 2016-05-08 MED ORDER — CALCIUM CARBONATE ANTACID 500 MG PO CHEW: 2.0000 | CHEWABLE_TABLET | ORAL | Status: DC | PRN

## 2016-05-08 MED ORDER — DOCUSATE SODIUM 100 MG PO CAPS: 100.0000 mg | ORAL_CAPSULE | Freq: Every day | ORAL | Status: DC

## 2016-05-08 MED ORDER — GI COCKTAIL ~~LOC~~
30.0000 mL | Freq: Once | ORAL | Status: AC
  Administered 2016-05-08: 30 mL via ORAL
  Filled 2016-05-08: qty 30

## 2016-05-08 MED ORDER — PRENATAL MULTIVITAMIN CH
1.0000 | ORAL_TABLET | Freq: Every day | ORAL | Status: DC
  Administered 2016-05-09: 1 via ORAL
  Filled 2016-05-08: qty 1

## 2016-05-08 NOTE — H&P (Signed)
21 y.o. 6875w0d  G2P1001 comes in c/o epigastric pain.  Otherwise has good fetal movement and no bleeding.  After labs returned normal and epigastric pain resolved pt reported onset of contractions.  FFN sent and returned negative, but per MAU staff cervical change noted from 3 to 4cm and pt was uncomfortable.  Past Medical History:  Diagnosis Date  . Medical history non-contributory     Past Surgical History:  Procedure Laterality Date  . NO PAST SURGERIES      OB History  Gravida Para Term Preterm AB Living  2 1 1     1   SAB TAB Ectopic Multiple Live Births          1    # Outcome Date GA Lbr Len/2nd Weight Sex Delivery Anes PTL Lv  2 Current           1 Term 04/02/15 4447w3d  2.835 kg (6 lb 4 oz) M Vag-Spont  N LIV      Social History   Social History  . Marital status: Married    Spouse name: N/A  . Number of children: N/A  . Years of education: N/A   Occupational History  . Not on file.   Social History Main Topics  . Smoking status: Never Smoker  . Smokeless tobacco: Never Used  . Alcohol use No  . Drug use: No  . Sexual activity: Not Currently    Birth control/ protection: None   Other Topics Concern  . Not on file   Social History Narrative  . No narrative on file   Kiwi extract    Prenatal Transfer Tool  Maternal Diabetes: No Genetic Screening: Declined Maternal Ultrasounds/Referrals: Normal Fetal Ultrasounds or other Referrals:  None Maternal Substance Abuse:  No Significant Maternal Medications:  None Significant Maternal Lab Results: Lab values include: Other:  rapid GBS pending  Other PNC: short interval pregnancy, last delivery 03/2015    Vitals:   05/08/16 1512  BP: 97/59  Pulse: 100  Resp: 18  Temp: 98.1 F (36.7 C)     Lungs/Cor:  NAD Abdomen:  soft, gravid Ex:  no cords, erythema SVE:  3/long/-4 per my exam FHTs:  140, good STV, NST R Toco:  irregular   A/P   Admitted for PTL monitoring  GBS collected an pending  On my  exam, cervix very long and head high, FFN negative.  Beta administered  Will monitor overnight and reeval in am, if no change will d/c home to return for 2nd dose beta.  Cont. EFW/TOCO  MgSO4 not indicated at this time, advised pt to report to RN if contractions more painful or more more frequent to recheck  [place], Dwight D. Eisenhower Va Medical CenterIDNEY

## 2016-05-08 NOTE — MAU Provider Note (Signed)
Chief Complaint:  Abdominal Pain   First Provider Initiated Contact with Patient 05/08/16 1524      HPI: Dominique Tanner is a 21 y.o. G2P1001 at 44w0dwho presents to maternity admissions reporting pain in her mid upper abdomen that started this morning intermittently but is now constant. The pain is a new symptom,  worsening since onset, not improved with position change or drinking water and not worsened by anything she has tried. She also reports lower abdominal cramps that are irregular. She has not tried any treatments and these are the same since onset earlier today.  She reports good fetal movement, denies regular contractions, LOF, vaginal bleeding, vaginal itching/burning, urinary symptoms, h/a, dizziness, n/v, or fever/chills.    HPI  Past Medical History: Past Medical History:  Diagnosis Date  . Medical history non-contributory     Past obstetric history: OB History  Gravida Para Term Preterm AB Living  2 1 1     1   SAB TAB Ectopic Multiple Live Births          1    # Outcome Date GA Lbr Len/2nd Weight Sex Delivery Anes PTL Lv  2 Current           1 Term 04/02/15 [redacted]w[redacted]d  6 lb 4 oz (2.835 kg) M Vag-Spont  N LIV      Past Surgical History: Past Surgical History:  Procedure Laterality Date  . NO PAST SURGERIES      Family History: No family history on file.  Social History: Social History  Substance Use Topics  . Smoking status: Never Smoker  . Smokeless tobacco: Never Used  . Alcohol use No    Allergies:  Allergies  Allergen Reactions  . Kiwi Extract Anaphylaxis and Itching    Meds:  Prescriptions Prior to Admission  Medication Sig Dispense Refill Last Dose  . Prenatal Vit-Fe Fumarate-FA (PRENATAL MULTIVITAMIN) TABS tablet Take 1 tablet by mouth daily at 12 noon.   05/08/2016 at Unknown time  . docusate sodium (COLACE) 100 MG capsule Take 1 capsule (100 mg total) by mouth daily. (Patient not taking: Reported on 04/10/2016) 30 capsule 0 Not Taking at Unknown time     ROS:  Review of Systems  Constitutional: Negative for chills, fatigue and fever.  Eyes: Negative for visual disturbance.  Respiratory: Negative for shortness of breath.   Cardiovascular: Negative for chest pain.  Gastrointestinal: Positive for abdominal pain. Negative for nausea and vomiting.  Genitourinary: Positive for pelvic pain. Negative for difficulty urinating, dysuria, flank pain, vaginal bleeding, vaginal discharge and vaginal pain.  Neurological: Negative for dizziness and headaches.  Psychiatric/Behavioral: Negative.      I have reviewed patient's Past Medical Hx, Surgical Hx, Family Hx, Social Hx, medications and allergies.   Physical Exam   Patient Vitals for the past 24 hrs:  BP Temp Temp src Pulse Resp Weight  05/08/16 1512 97/59 98.1 F (36.7 C) Oral 100 18 -  05/08/16 1501 - - - - - 155 lb 1.3 oz (70.3 kg)   Constitutional: Well-developed, well-nourished female in no acute distress.  Cardiovascular: normal rate Respiratory: normal effort GI: Abd soft, non-tender, gravid appropriate for gestational age. Fetal position with Leopolds noted to be ROA with fetal small parts (foot/heel) pressing in epigastric area.  MS: Extremities nontender, no edema, normal ROM Neurologic: Alert and oriented x 4.  GU: Neg CVAT.  FFN collected prior to SVE. Cervix 3/50/-3, vertex on initial exam   Recheck of cervix in 1 hour, 4/50/-3, vertex  Dilation: 3.5 Effacement (%): 50 Presentation: Vertex Exam by:: L.Leftwich-Kirby,CNM  FHT:  Baseline 135 , moderate variability, accelerations present, no decelerations Contractions: irregular 2-15 minutes upon arrival, resolved then resumed but irregular, mild to palpation   Labs: Results for orders placed or performed during the hospital encounter of 05/08/16 (from the past 24 hour(s))  Urinalysis, Routine w reflex microscopic     Status: Abnormal   Collection Time: 05/08/16  2:57 PM  Result Value Ref Range   Color, Urine AMBER  (A) YELLOW   APPearance CLOUDY (A) CLEAR   Specific Gravity, Urine 1.025 1.005 - 1.030   pH 6.5 5.0 - 8.0   Glucose, UA NEGATIVE NEGATIVE mg/dL   Hgb urine dipstick NEGATIVE NEGATIVE   Bilirubin Urine NEGATIVE NEGATIVE   Ketones, ur NEGATIVE NEGATIVE mg/dL   Protein, ur NEGATIVE NEGATIVE mg/dL   Nitrite NEGATIVE NEGATIVE   Leukocytes, UA NEGATIVE NEGATIVE  CBC     Status: Abnormal   Collection Time: 05/08/16  3:50 PM  Result Value Ref Range   WBC 8.1 4.0 - 10.5 K/uL   RBC 3.91 3.87 - 5.11 MIL/uL   Hemoglobin 10.5 (L) 12.0 - 15.0 g/dL   HCT 56.328.7 (L) 87.536.0 - 64.346.0 %   MCV 73.4 (L) 78.0 - 100.0 fL   MCH 26.9 26.0 - 34.0 pg   MCHC 36.6 (H) 30.0 - 36.0 g/dL   RDW 32.914.1 51.811.5 - 84.115.5 %   Platelets 219 150 - 400 K/uL  Amylase     Status: None   Collection Time: 05/08/16  3:50 PM  Result Value Ref Range   Amylase 97 28 - 100 U/L  Lipase, blood     Status: None   Collection Time: 05/08/16  3:50 PM  Result Value Ref Range   Lipase 32 11 - 51 U/L  Comprehensive metabolic panel     Status: Abnormal   Collection Time: 05/08/16  3:50 PM  Result Value Ref Range   Sodium 134 (L) 135 - 145 mmol/L   Potassium 3.6 3.5 - 5.1 mmol/L   Chloride 104 101 - 111 mmol/L   CO2 21 (L) 22 - 32 mmol/L   Glucose, Bld 85 65 - 99 mg/dL   BUN 8 6 - 20 mg/dL   Creatinine, Ser 6.600.62 0.44 - 1.00 mg/dL   Calcium 8.5 (L) 8.9 - 10.3 mg/dL   Total Protein 7.1 6.5 - 8.1 g/dL   Albumin 3.1 (L) 3.5 - 5.0 g/dL   AST 15 15 - 41 U/L   ALT 10 (L) 14 - 54 U/L   Alkaline Phosphatase 87 38 - 126 U/L   Total Bilirubin 0.7 0.3 - 1.2 mg/dL   GFR calc non Af Amer >60 >60 mL/min   GFR calc Af Amer >60 >60 mL/min   Anion gap 9 5 - 15  Fetal fibronectin     Status: None   Collection Time: 05/08/16  6:10 PM  Result Value Ref Range   Fetal Fibronectin NEGATIVE NEGATIVE  Wet prep, genital     Status: Abnormal   Collection Time: 05/08/16  6:10 PM  Result Value Ref Range   Yeast Wet Prep HPF POC NONE SEEN NONE SEEN   Trich,  Wet Prep NONE SEEN NONE SEEN   Clue Cells Wet Prep HPF POC NONE SEEN NONE SEEN   WBC, Wet Prep HPF POC FEW (A) NONE SEEN   Sperm NONE SEEN       Imaging:  [place] M[facility] Ob Transvaginal  Result Date: 04/10/2016  OBSTETRICAL ULTRASOUND: This exam was performed within a [facility]. The OB US report was generated in the [facility] system, and faxed to the ordering physician.  This report is available in the [employer]. See the [facility] Obstetric US report via the Image Link.   MAU Course/MDM: I have ordered labs and reviewed results.  NST reviewed No evidence of acute abdomen, no acute medical condition causing epigastric pain.  No resolution with GI Cocktail so may be fetal position vs acid reflux. While in MAU, pt upper abdominal pain improved but she reported more uncomfortable cramping/contraction lower abdominal pain. Cervix rechecked and slight change from 3-4 cm.   Consult Claiborne Billings with presentation, exam findings and test results. Although FFN negative, cervical change and pt increased discomfort warrant further evaluation. BMZ x 2 doses in 24 hours, admit for observation at this time Treatments in MAU included GI Cocktail.   Pt stable at time of transfer.  Today's evaluation included a work-up for preterm labor which can be life-threatening for both mom and baby.  Assessment: No diagnosis found.  Plan: Admit for observation EFM Q shift Continuous toco BMZ now and in 24 hours IV fluids   Sharen Counter Certified Nurse-Midwife 05/08/2016 8:40 PM

## 2016-05-08 NOTE — MAU Note (Signed)
Urine sent to lab 

## 2016-05-08 NOTE — MAU Note (Signed)
Patient presents to mau with c/o upper abdominal pain that radiates to her back. Started 1.5 hours ago; rating the pain 7/10. States it feels like "menstraul cramps". Denies LOF, VB at this time. +FM

## 2016-05-09 ENCOUNTER — Encounter (HOSPITAL_COMMUNITY): Payer: Self-pay | Admitting: *Deleted

## 2016-05-09 DIAGNOSIS — O4703 False labor before 37 completed weeks of gestation, third trimester: Secondary | ICD-10-CM | POA: Diagnosis not present

## 2016-05-09 LAB — GROUP B STREP BY PCR: GROUP B STREP BY PCR: NEGATIVE

## 2016-05-09 NOTE — Progress Notes (Addendum)
Pt reports sleeping overnight, but this morning contractions feeling frequent and painful.  She denies LOF or bleeding, reports good FM.  Vitals:   05/09/16 0015 05/09/16 0545 05/09/16 0740 05/09/16 0959  BP: (!) 101/50 (!) 103/50 (!) 96/46 (!) 103/45  Pulse: 76 (!) 107 70 98  Resp: 18 18 16    Temp: 98.3 F (36.8 C) 98.2 F (36.8 C) 98 F (36.7 C)   TempSrc: Oral Oral Oral   SpO2: 100%  100%   Weight: 70.3 kg (155 lb)     Height: 5\' 1"  (1.549 m)        Abd: gravid, NT Ext: no CT  Lab Results  Component Value Date   WBC 8.1 05/08/2016   HGB 10.5 (L) 05/08/2016   HCT 28.7 (L) 05/08/2016   MCV 73.4 (L) 05/08/2016   PLT 219 05/08/2016    --/--/A POS, A POS (02/02 2118)  A/P HD#2 monitoring for PTL  S/p beta x 1, due around 8pm this evening for #2  Discussed possible used of Procardia for symtpomatic control of contraction pain, discussed mixed literature and less likely benefit of it stopping PTL  Discussed MgSO4 for neuroprophylaxis.  Pt now 32.1  Will continue to monitor.  Routine care.  Philip AspenALLAHAN, Shakiyla Kook

## 2016-05-10 DIAGNOSIS — O4703 False labor before 37 completed weeks of gestation, third trimester: Secondary | ICD-10-CM | POA: Diagnosis not present

## 2016-05-10 NOTE — Progress Notes (Signed)
Discharged to main entrance with husband by NT.  All belongings at bedside sent home with patient no equipment sent home with patient.  In stable condition upon discharge, ambulatory.

## 2016-05-10 NOTE — Discharge Summary (Signed)
Pt presented for epigastric pain to MAU.  While there reported painful contractions.  FFN was negative but MAU staff suspicious of cervical change from 3 to 3.5/4cm and 50% effacement.  Pt very uncomfortable so admitted for betamethasone for FLM and observation.  On recheck no change to cervix and pt slept well overnight.  Had discussed d/c home with return for 2nd dose of steroids but HD2 morning but stated contraction pain had worsened and was nauseous and dry heaving.  She stated she preferred to stay in house for continued monitoring given worsening ctx pain, tocolytics and MgSO4 not started.  HD#3 pt reported decreased FM, NST was reactive and after a meal movement resumed to normal.  Cervix was rechecked and no changed noted.  PTL precautions reviewed with patient and advised to f/u as scheduled 05/13/2016

## 2016-06-03 ENCOUNTER — Other Ambulatory Visit: Payer: Self-pay | Admitting: Obstetrics & Gynecology

## 2016-06-08 ENCOUNTER — Encounter (HOSPITAL_COMMUNITY): Payer: Self-pay

## 2016-06-08 ENCOUNTER — Inpatient Hospital Stay (HOSPITAL_COMMUNITY)
Admission: AD | Admit: 2016-06-08 | Discharge: 2016-06-08 | Disposition: A | Discharge status: Home / Self Care | Payer: BLUE CROSS/BLUE SHIELD | Source: Ambulatory Visit | Attending: Obstetrics and Gynecology | Admitting: Obstetrics and Gynecology

## 2016-06-08 DIAGNOSIS — M549 Dorsalgia, unspecified: Secondary | ICD-10-CM

## 2016-06-08 DIAGNOSIS — F5089 Other specified eating disorder: Secondary | ICD-10-CM | POA: Diagnosis not present

## 2016-06-08 DIAGNOSIS — O26893 Other specified pregnancy related conditions, third trimester: Secondary | ICD-10-CM | POA: Diagnosis not present

## 2016-06-08 DIAGNOSIS — O9989 Other specified diseases and conditions complicating pregnancy, childbirth and the puerperium: Secondary | ICD-10-CM

## 2016-06-08 DIAGNOSIS — N760 Acute vaginitis: Secondary | ICD-10-CM

## 2016-06-08 DIAGNOSIS — L292 Pruritus vulvae: Secondary | ICD-10-CM | POA: Diagnosis not present

## 2016-06-08 DIAGNOSIS — O23593 Infection of other part of genital tract in pregnancy, third trimester: Secondary | ICD-10-CM | POA: Diagnosis not present

## 2016-06-08 DIAGNOSIS — Z3A36 36 weeks gestation of pregnancy: Secondary | ICD-10-CM | POA: Diagnosis not present

## 2016-06-08 LAB — CBC
HEMATOCRIT: 29.6 % — AB (ref 36.0–46.0)
HEMOGLOBIN: 10.6 g/dL — AB (ref 12.0–15.0)
MCH: 25.8 pg — ABNORMAL LOW (ref 26.0–34.0)
MCHC: 35.8 g/dL (ref 30.0–36.0)
MCV: 72 fL — ABNORMAL LOW (ref 78.0–100.0)
Platelets: 230 10*3/uL (ref 150–400)
RBC: 4.11 MIL/uL (ref 3.87–5.11)
RDW: 15.3 % (ref 11.5–15.5)
WBC: 8.4 10*3/uL (ref 4.0–10.5)

## 2016-06-08 LAB — URINALYSIS, ROUTINE W REFLEX MICROSCOPIC
BACTERIA UA: NONE SEEN
BILIRUBIN URINE: NEGATIVE
GLUCOSE, UA: 50 mg/dL — AB
HGB URINE DIPSTICK: NEGATIVE
Ketones, ur: 20 mg/dL — AB
LEUKOCYTES UA: NEGATIVE
NITRITE: NEGATIVE
Protein, ur: 100 mg/dL — AB
SPECIFIC GRAVITY, URINE: 1.033 — AB (ref 1.005–1.030)
pH: 5 (ref 5.0–8.0)

## 2016-06-08 LAB — WET PREP, GENITAL
Clue Cells Wet Prep HPF POC: NONE SEEN
SPERM: NONE SEEN
Trich, Wet Prep: NONE SEEN
YEAST WET PREP: NONE SEEN

## 2016-06-08 MED ORDER — TRIAMCINOLONE ACETONIDE 0.1 % EX CREA: 1.0000 "application " | TOPICAL_CREAM | Freq: Two times a day (BID) | CUTANEOUS | 0 refills | Status: DC

## 2016-06-08 NOTE — Discharge Instructions (Signed)

## 2016-06-08 NOTE — MAU Provider Note (Signed)
History     CSN: [id number]  Arrival date and time: 06/08/16 1842   First Provider Initiated Contact with Patient 06/08/16 1932      Chief Complaint  Patient presents with  . Vaginal Itching   HPI  Dominique Tanner is a 21 y.o. G2P1001 at [redacted]w[redacted]d who presents with vaginal itching. Symptoms began yesterday. Has not noticed change or increase in vaginal discharge. Itching/irritation worse on right labia. No change in soaps or detergents. Reports occasional contractions & low back pain. Denies vaginal bleeding or LOF. Positive fetal movement.  Also complains of urge to eat clay. States she watches [employer] of people eating clay but hasn't eaten it herself.  Pt has appt in office with Dr. Henderson Cloud tomorrow.  OB History    Gravida Para Term Preterm AB Living   2 1 1     1    SAB TAB Ectopic Multiple Live Births           1      Past Medical History:  Diagnosis Date  . Medical history non-contributory     Past Surgical History:  Procedure Laterality Date  . NO PAST SURGERIES      No family history on file.  Social History  Substance Use Topics  . Smoking status: Never Smoker  . Smokeless tobacco: Never Used  . Alcohol use No    Allergies:  Allergies  Allergen Reactions  . Kiwi Extract Anaphylaxis and Itching    Prescriptions Prior to Admission  Medication Sig Dispense Refill Last Dose  . docusate sodium (COLACE) 100 MG capsule Take 1 capsule (100 mg total) by mouth daily. (Patient not taking: Reported on 04/10/2016) 30 capsule 0 Not Taking at Unknown time  . Prenatal Vit-Fe Fumarate-FA (PRENATAL MULTIVITAMIN) TABS tablet Take 1 tablet by mouth daily at 12 noon.   05/08/2016 at Unknown time    Review of Systems  Gastrointestinal: Negative for abdominal pain, constipation, diarrhea, nausea and vomiting.  Genitourinary: Negative for dysuria, genital sores, vaginal bleeding and vaginal discharge.       + vaginal itching   Physical Exam   Blood pressure 113/57, pulse 99,  temperature 99.1 F (37.3 C), temperature source Oral, resp. rate 18, weight 156 lb 8 oz (71 kg), last menstrual period 09/20/2015, SpO2 100 %, not currently breastfeeding.  Physical Exam  Nursing note and vitals reviewed. Constitutional: She is oriented to person, place, and time. She appears well-developed and well-nourished. No distress.  HENT:  Head: Normocephalic and atraumatic.  Eyes: Conjunctivae are normal. Right eye exhibits no discharge. Left eye exhibits no discharge. No scleral icterus.  Neck: Normal range of motion.  Respiratory: Effort normal. No respiratory distress.  Genitourinary: Cervix exhibits no friability. There is erythema in the vagina. No bleeding in the vagina. Vaginal discharge found.  Genitourinary Comments: Right labia minora erythematous with some excoriation. No lesions. Small amount of tan mucoid discharge.   Neurological: She is alert and oriented to person, place, and time.  Skin: Skin is warm and dry. She is not diaphoretic.  Psychiatric: She has a normal mood and affect. Her behavior is normal. Judgment and thought content normal.   Dilation: 3 Effacement (%): 40 Cervical Position: Posterior Station: Ballotable Exam by:: Judeth Horn NP  Fetal Tracing:  Baseline: 135 Variability: moderate Accelerations: 15x15 Decelerations: early  Toco: irr ctx MAU Course  Procedures Results for orders placed or performed during the hospital encounter of 06/08/16 (from the past 24 hour(s))  Urinalysis, Routine w reflex microscopic  Status: Abnormal   Collection Time: 06/08/16  7:05 PM  Result Value Ref Range   Color, Urine YELLOW YELLOW   APPearance HAZY (A) CLEAR   Specific Gravity, Urine 1.033 (H) 1.005 - 1.030   pH 5.0 5.0 - 8.0   Glucose, UA 50 (A) NEGATIVE mg/dL   Hgb urine dipstick NEGATIVE NEGATIVE   Bilirubin Urine NEGATIVE NEGATIVE   Ketones, ur 20 (A) NEGATIVE mg/dL   Protein, ur 960100 (A) NEGATIVE mg/dL   Nitrite NEGATIVE NEGATIVE    Leukocytes, UA NEGATIVE NEGATIVE   RBC / HPF 0-5 0 - 5 RBC/hpf   WBC, UA 0-5 0 - 5 WBC/hpf   Bacteria, UA NONE SEEN NONE SEEN   Squamous Epithelial / LPF 0-5 (A) NONE SEEN   Mucous PRESENT   Wet prep, genital     Status: Abnormal   Collection Time: 06/08/16  7:45 PM  Result Value Ref Range   Yeast Wet Prep HPF POC NONE SEEN NONE SEEN   Trich, Wet Prep NONE SEEN NONE SEEN   Clue Cells Wet Prep HPF POC NONE SEEN NONE SEEN   WBC, Wet Prep HPF POC MANY (A) NONE SEEN   Sperm NONE SEEN   CBC     Status: Abnormal   Collection Time: 06/08/16  8:04 PM  Result Value Ref Range   WBC 8.4 4.0 - 10.5 K/uL   RBC 4.11 3.87 - 5.11 MIL/uL   Hemoglobin 10.6 (L) 12.0 - 15.0 g/dL   HCT 45.429.6 (L) 09.836.0 - 11.946.0 %   MCV 72.0 (L) 78.0 - 100.0 fL   MCH 25.8 (L) 26.0 - 34.0 pg   MCHC 35.8 30.0 - 36.0 g/dL   RDW 14.715.3 82.911.5 - 56.215.5 %   Platelets 230 150 - 400 K/uL    MDM Reactive fetal tracing SVE unchanged from office visit Wet prep negative GC/CT negative 2/28 CBC - hemoglobin stable compared to last month, 10.6 S/w Dr. Dareen PianoAnderson. Can offer patient topical steroid for itching.  Assessment and Plan  A: 1. Vaginitis and vulvovaginitis   2. Pica   3. Back pain affecting pregnancy in third trimester    P: Discharge home Rx triamcinolone Keep f/u with OB Discussed reasons to return to MAU  Judeth HornErin Kari Montero 06/08/2016, 7:48 PM

## 2016-06-08 NOTE — MAU Note (Signed)
last night was laying down, noted some vag itching, when she urinates, it itches even worse, is only on the rt side.  Also has noted an increase in discharge.  Started having back pain this afternoon, lower stomach started hurting.

## 2016-06-08 NOTE — MAU Note (Signed)
Having urges to eat clay. Talking about wanting to "eat some so bad." Asking, "is that normal?"

## 2016-06-19 ENCOUNTER — Inpatient Hospital Stay (HOSPITAL_COMMUNITY)
Admission: AD | Admit: 2016-06-19 | Discharge: 2016-06-19 | Disposition: A | Discharge status: Home / Self Care | Payer: BLUE CROSS/BLUE SHIELD | Source: Ambulatory Visit | Attending: Obstetrics and Gynecology | Admitting: Obstetrics and Gynecology

## 2016-06-19 ENCOUNTER — Encounter (HOSPITAL_COMMUNITY): Payer: Self-pay

## 2016-06-19 DIAGNOSIS — N898 Other specified noninflammatory disorders of vagina: Secondary | ICD-10-CM | POA: Insufficient documentation

## 2016-06-19 DIAGNOSIS — O471 False labor at or after 37 completed weeks of gestation: Secondary | ICD-10-CM | POA: Insufficient documentation

## 2016-06-19 DIAGNOSIS — O26893 Other specified pregnancy related conditions, third trimester: Secondary | ICD-10-CM | POA: Insufficient documentation

## 2016-06-19 DIAGNOSIS — Z3A38 38 weeks gestation of pregnancy: Secondary | ICD-10-CM | POA: Insufficient documentation

## 2016-06-19 LAB — POCT FERN TEST

## 2016-06-19 NOTE — Discharge Instructions (Signed)

## 2016-06-19 NOTE — MAU Provider Note (Signed)
Chief Complaint:  Rupture of Membranes   HPI: Dominique Tanner is a 21 y.o. G2P1001 at [redacted]w[redacted]d who presents to maternity admissions reporting possible ROM.  This morning when patient went to the bathroom to urinate, she was finished and was going to pull up her pants when she felt another big gush of fluid come out per vagina. At this was more urine or if her water broke. She called her office and was sent in to be evaluated for possible rupture of membranes. He states she feels like her abdomen has been tightening for the past day, feeling like it's constantly hard. She says she feels occasional contractions and have been getting stronger over the past week. But states that they have not been persistently 5 minutes apart or less. Denies vaginal bleeding. Good fetal movement.     Past Medical History: Past Medical History:  Diagnosis Date  . Medical history non-contributory     Past obstetric history: OB History  Gravida Para Term Preterm AB Living  2 1 1     1   SAB TAB Ectopic Multiple Live Births          1    # Outcome Date GA Lbr Len/2nd Weight Sex Delivery Anes PTL Lv  2 Current           1 Term 04/02/15 [redacted]w[redacted]d  6 lb 4 oz (2.835 kg) M Vag-Spont  N LIV      Past Surgical History: Past Surgical History:  Procedure Laterality Date  . NO PAST SURGERIES       Family History: History reviewed. No pertinent family history.  Social History: Social History  Substance Use Topics  . Smoking status: Never Smoker  . Smokeless tobacco: Never Used  . Alcohol use No    Allergies:  Allergies  Allergen Reactions  . Kiwi Extract Anaphylaxis and Itching    Meds:  Prescriptions Prior to Admission  Medication Sig Dispense Refill Last Dose  . Prenatal Vit-Fe Fumarate-FA (PRENATAL MULTIVITAMIN) TABS tablet Take 1 tablet by mouth daily at 12 noon.   06/18/2016 at Unknown time  . triamcinolone cream (KENALOG) 0.1 % Apply 1 application topically 2 (two) times daily. (Patient not taking:  Reported on 06/19/2016) 15 g 0 Not Taking at Unknown time    I have reviewed patient's Past Medical Hx, Surgical Hx, Family Hx, Social Hx, medications and allergies.   ROS:  A comprehensive ROS was negative except per HPI.    Physical Exam   Patient Vitals for the past 24 hrs:  BP Temp Temp src Pulse Resp SpO2 Height Weight  06/19/16 1325 100/68 97.9 F (36.6 C) Oral 77 18 100 % 5' (1.524 m) 161 lb 0.6 oz (73 kg)   Constitutional: Well-developed, well-nourished female in no acute distress.  Cardiovascular: normal rate Respiratory: normal effort GI: Abd soft, non-tender, gravid appropriate for gestational age. Pos BS x 4 MS: Extremities nontender, no edema, normal ROM Neurologic: Alert and oriented x 4.  GU: Neg CVAT. Pelvic: NEFG, physiologic discharge, no blood, cervix clean. No CMT. No pooling.  FERN: NEG SVE: 4/70/ballotable, bag intact, cephalic   FHT:  Baseline 130 , moderate variability, accelerations present, no decelerations Contractions: Irregular 10-15 mins   Labs: Results for orders placed or performed during the hospital encounter of 06/19/16 (from the past 24 hour(s))  POCT fern test     Status: Normal   Collection Time: 06/19/16  2:02 PM  Result Value Ref Range   POCT [place] Test  Imaging:  No results found.  MAU Course: SSE - NO POOLING SVE - 4/70/ballotable, bag intact, cephalic NST - reactive, irregular contractions FERN - NEG  MDM: Plan of care reviewed with patient, including labs and tests ordered and medical treatment. Discussed with Dr. Claiborne Billingsallahan who agrees OK for discharge home with labor precautions given. Discussed with patient presents to return to MAU, including if her water breaks, she begins having regular strong contractions, or does not feel baby move. Patient verbalized understanding.  I personally reviewed the patient's NST today, found to be REACTIVE. 130 bpm, mod var, +accels, no decels. CTX: Irregular, q10-15  min.   Assessment: 1. False labor after 37 completed weeks of gestation   2. Vaginal discharge during pregnancy in third trimester     Plan: Discharge home in stable condition.  Labor precautions and fetal kick counts  Follow-up Information    [facility]. Schedule an appointment as soon as possible for a visit in 1 week(s).   Why:  Routine OB visit Contact information: [address] [place] [address] [phone]           Allergies as of 06/19/2016      Reactions   Kiwi Extract Anaphylaxis, Itching      Medication List    STOP taking these medications   triamcinolone cream 0.1 % Commonly known as:  KENALOG     TAKE these medications   prenatal multivitamin Tabs tablet Take 1 tablet by mouth daily at 12 noon.       Jen MowElizabeth Mumaw, DO OB Fellow [facility] 06/19/2016 3:03 PM

## 2016-06-19 NOTE — MAU Note (Signed)
Patient states when she got up this morning around 9am she had a big gush of clear fluid; happened later in the morning but not as much. Irregular contractions. Vaginal bleeding. +FM. Endorses 3-4cm at last ve.

## 2016-06-24 ENCOUNTER — Encounter (HOSPITAL_COMMUNITY): Payer: Self-pay

## 2016-06-24 ENCOUNTER — Inpatient Hospital Stay (HOSPITAL_COMMUNITY)
Admission: AD | Admit: 2016-06-24 | Discharge: 2016-06-26 | DRG: 775 | Disposition: A | Discharge status: Home / Self Care | Payer: BLUE CROSS/BLUE SHIELD | Source: Ambulatory Visit | Attending: Obstetrics and Gynecology | Admitting: Obstetrics and Gynecology

## 2016-06-24 DIAGNOSIS — Z3490 Encounter for supervision of normal pregnancy, unspecified, unspecified trimester: Secondary | ICD-10-CM

## 2016-06-24 DIAGNOSIS — Z3A39 39 weeks gestation of pregnancy: Secondary | ICD-10-CM | POA: Diagnosis not present

## 2016-06-24 DIAGNOSIS — Z3493 Encounter for supervision of normal pregnancy, unspecified, third trimester: Secondary | ICD-10-CM | POA: Diagnosis present

## 2016-06-24 LAB — TYPE AND SCREEN
ABO/RH(D): A POS
ANTIBODY SCREEN: NEGATIVE

## 2016-06-24 LAB — CBC
HCT: 32.1 % — ABNORMAL LOW (ref 36.0–46.0)
HEMOGLOBIN: 11.2 g/dL — AB (ref 12.0–15.0)
MCH: 24.6 pg — ABNORMAL LOW (ref 26.0–34.0)
MCHC: 34.9 g/dL (ref 30.0–36.0)
MCV: 70.4 fL — ABNORMAL LOW (ref 78.0–100.0)
Platelets: 209 10*3/uL (ref 150–400)
RBC: 4.56 MIL/uL (ref 3.87–5.11)
RDW: 16.1 % — AB (ref 11.5–15.5)
WBC: 9.3 10*3/uL (ref 4.0–10.5)

## 2016-06-24 MED ORDER — SENNOSIDES-DOCUSATE SODIUM 8.6-50 MG PO TABS
2.0000 | ORAL_TABLET | ORAL | Status: DC
  Administered 2016-06-24 – 2016-06-25 (×2): 2 via ORAL
  Filled 2016-06-24 (×2): qty 2

## 2016-06-24 MED ORDER — OXYCODONE-ACETAMINOPHEN 5-325 MG PO TABS: 1.0000 | ORAL_TABLET | ORAL | Status: DC | PRN

## 2016-06-24 MED ORDER — IBUPROFEN 600 MG PO TABS
600.0000 mg | ORAL_TABLET | Freq: Four times a day (QID) | ORAL | Status: DC
  Administered 2016-06-24 – 2016-06-26 (×9): 600 mg via ORAL
  Filled 2016-06-24 (×9): qty 1

## 2016-06-24 MED ORDER — BENZOCAINE-MENTHOL 20-0.5 % EX AERO: 1.0000 "application " | INHALATION_SPRAY | CUTANEOUS | Status: DC | PRN

## 2016-06-24 MED ORDER — MEASLES, MUMPS & RUBELLA VAC ~~LOC~~ INJ
0.5000 mL | INJECTION | Freq: Once | SUBCUTANEOUS | Status: DC
  Filled 2016-06-24: qty 0.5

## 2016-06-24 MED ORDER — OXYCODONE-ACETAMINOPHEN 5-325 MG PO TABS
2.0000 | ORAL_TABLET | ORAL | Status: DC | PRN
  Administered 2016-06-24 – 2016-06-25 (×2): 2 via ORAL
  Filled 2016-06-24 (×2): qty 2

## 2016-06-24 MED ORDER — SOD CITRATE-CITRIC ACID 500-334 MG/5ML PO SOLN: 30.0000 mL | ORAL | Status: DC | PRN

## 2016-06-24 MED ORDER — WITCH HAZEL-GLYCERIN EX PADS: 1.0000 "application " | MEDICATED_PAD | CUTANEOUS | Status: DC | PRN

## 2016-06-24 MED ORDER — DIBUCAINE 1 % RE OINT: 1.0000 "application " | TOPICAL_OINTMENT | RECTAL | Status: DC | PRN

## 2016-06-24 MED ORDER — ACETAMINOPHEN 325 MG PO TABS
650.0000 mg | ORAL_TABLET | ORAL | Status: DC | PRN
  Administered 2016-06-24 (×2): 650 mg via ORAL
  Filled 2016-06-24 (×2): qty 2

## 2016-06-24 MED ORDER — ACETAMINOPHEN 325 MG PO TABS: 650.0000 mg | ORAL_TABLET | ORAL | Status: DC | PRN

## 2016-06-24 MED ORDER — LACTATED RINGERS IV SOLN: 500.0000 mL | INTRAVENOUS | Status: DC | PRN

## 2016-06-24 MED ORDER — OXYTOCIN BOLUS FROM INFUSION
500.0000 mL | Freq: Once | INTRAVENOUS | Status: AC
  Administered 2016-06-24: 500 mL via INTRAVENOUS

## 2016-06-24 MED ORDER — LIDOCAINE HCL (PF) 1 % IJ SOLN
30.0000 mL | INTRAMUSCULAR | Status: DC | PRN
Start: 2016-06-24 — End: 2016-06-24
  Filled 2016-06-24: qty 30

## 2016-06-24 MED ORDER — OXYCODONE-ACETAMINOPHEN 5-325 MG PO TABS: 2.0000 | ORAL_TABLET | ORAL | Status: DC | PRN

## 2016-06-24 MED ORDER — TETANUS-DIPHTH-ACELL PERTUSSIS 5-2.5-18.5 LF-MCG/0.5 IM SUSP
0.5000 mL | Freq: Once | INTRAMUSCULAR | Status: DC
Start: 2016-06-25 — End: 2016-06-26

## 2016-06-24 MED ORDER — ZOLPIDEM TARTRATE 5 MG PO TABS: 5.0000 mg | ORAL_TABLET | Freq: Every evening | ORAL | Status: DC | PRN

## 2016-06-24 MED ORDER — ONDANSETRON HCL 4 MG/2ML IJ SOLN: 4.0000 mg | INTRAMUSCULAR | Status: DC | PRN

## 2016-06-24 MED ORDER — OXYTOCIN 40 UNITS IN LACTATED RINGERS INFUSION - SIMPLE MED
2.5000 [IU]/h | INTRAVENOUS | Status: DC
  Filled 2016-06-24: qty 1000

## 2016-06-24 MED ORDER — ONDANSETRON HCL 4 MG/2ML IJ SOLN: 4.0000 mg | Freq: Four times a day (QID) | INTRAMUSCULAR | Status: DC | PRN

## 2016-06-24 MED ORDER — LACTATED RINGERS IV SOLN
INTRAVENOUS | Status: DC
  Administered 2016-06-24: 04:00:00 via INTRAVENOUS

## 2016-06-24 MED ORDER — SIMETHICONE 80 MG PO CHEW
80.0000 mg | CHEWABLE_TABLET | ORAL | Status: DC | PRN
Start: 2016-06-24 — End: 2016-06-26

## 2016-06-24 MED ORDER — COCONUT OIL OIL
1.0000 | TOPICAL_OIL | Status: DC | PRN
Start: 2016-06-24 — End: 2016-06-26

## 2016-06-24 MED ORDER — ONDANSETRON HCL 4 MG PO TABS: 4.0000 mg | ORAL_TABLET | ORAL | Status: DC | PRN

## 2016-06-24 MED ORDER — FLEET ENEMA 7-19 GM/118ML RE ENEM: 1.0000 | ENEMA | RECTAL | Status: DC | PRN

## 2016-06-24 NOTE — Progress Notes (Signed)
Notified of pt arrival in MAU and exam. Requesting MD to labor and delivery for delivery.

## 2016-06-24 NOTE — Lactation Note (Signed)
This note was copied from a baby's chart. Lactation Consultation Note  Patient Name: [name] ZOXWR'UToday's Date: 06/24/2016 Reason for consult: Initial assessment  Initial visit at 6 hours of life. Mom is a P2 who nursed her 1st child for 4 months, saying that she would have preferred to nurse for longer, but she had been put on a couple of antibiotics and was told that she would have to quit nursing. Mom reports that she had "too much milk" with her 1st child.   Infant latched w/ease (3rd time since birth), but Mom began feeling cramping and nausea, so consult was ended. RN notified of patient desire for pain medicine. Breastfeeding brochure was left in room, but can be reviewed tomorrow.   Lurline HareRichey, Itsel Opfer [facility] 06/24/2016, 10:19 AM

## 2016-06-24 NOTE — Progress Notes (Signed)
Pt OOB to bathroom. Steady gait noted. 400 cc urine voided without difficulty.  Peri care demonstrated and pt able to perform by self. Peri pads and ice pack applied. Pt ready for PP care.

## 2016-06-24 NOTE — Progress Notes (Signed)
Post Partum Day 0 Subjective: no complaints, up ad lib, voiding, tolerating PO, + flatus and No heavy bleeding  She notes difficulty with latch and her milk hasn't come in  Objective: Blood pressure (!) 109/56, pulse 95, temperature 99.1 F (37.3 C), temperature source Oral, resp. rate 16, height 5' (1.524 m), weight 73 kg (161 lb), last menstrual period 09/20/2015, SpO2 98 %, unknown if currently breastfeeding.  Physical Exam:  General: alert, cooperative and no distress Lochia: appropriate Uterine Fundus: firm perineum: No swelling DVT Evaluation: No evidence of DVT seen on physical exam. Negative Homan's sign. No cords or calf tenderness. No significant calf/ankle edema.   Recent Labs  06/24/16 0350  HGB 11.2*  HCT 32.1*    Assessment/Plan: PPD #0 continue current post partum care  Will D/C IV as lochia is normal and patient is tolerating PO Will put in a lactation consultant order Will order a breast pump for patient while she is in house.  Patient desires circumcision for her baby boy, discussed with RN to obtain consent. I reviewed the  Risks of a circumcision with the mother to include infection, bleeding taking off too much or too little foreskin.  Most likely circumcision will need to occur tomorrow >/= 12 hours after birth.    LOS: 0 days   Phuoc Huy STACIA 06/24/2016, 10:05 AM

## 2016-06-24 NOTE — H&P (Signed)
Pt is a 21 y/o black female, G2P1001 at term who presents to the hospital in labor. On admission pt was 9cm. PNC was complicated by PTL. She also has Hgb C trait. Partner was not checked. PMHX: see  [place] PE: VSSAF         HEENT-wnl         ABD-gravid, palp ctxs         FHTs- reactive IMP/ IUP at term in labor         Hbg C trait Plan/ Admit

## 2016-06-24 NOTE — MAU Note (Signed)
Pt reports to MAU via EMS w/ urge to push. FHT's 120's.

## 2016-06-25 LAB — RPR: RPR: NONREACTIVE

## 2016-06-25 MED ORDER — HYDROCORTISONE 1 % EX CREA
TOPICAL_CREAM | Freq: Three times a day (TID) | CUTANEOUS | Status: DC | PRN
  Administered 2016-06-25: 11:00:00 via TOPICAL
  Filled 2016-06-25: qty 28

## 2016-06-25 NOTE — Progress Notes (Signed)
Post Partum Day 1 Subjective: no complaints, up ad lib, voiding and tolerating PO  Objective: Blood pressure 103/73, pulse 82, temperature 98.1 F (36.7 C), temperature source Oral, resp. rate 18, height 5' (1.524 m), weight 73 kg (161 lb), last menstrual period 09/20/2015, SpO2 100 %, unknown if currently breastfeeding.  Physical Exam:  General: alert, cooperative and appears stated age Lochia: appropriate Uterine Fundus: firm DVT Evaluation: No evidence of DVT seen on physical exam.   Recent Labs  06/24/16 0350  HGB 11.2*  HCT 32.1*    Assessment/Plan: Plan for discharge tomorrow and Breastfeeding  Desires neonatal circumcision, R/B/A of procedure discussed at length. Pt understands that neonatal circumcision is not considered medically necessary and is elective. The risks include, but are not limited to bleeding, infection, damage to the penis, development of scar tissue, and having to have it redone at a later date. Pt understands theses risks and wishes to proceed    LOS: 1 day   Dejanee Thibeaux H. 06/25/2016, 9:13 AM

## 2016-06-25 NOTE — Progress Notes (Signed)
Complaining of right foot itching.  Dr Tenny Crawoss notified, see new orders.

## 2016-06-26 NOTE — Discharge Summary (Signed)
Obstetric Discharge Summary Reason for Admission: onset of labor Prenatal Procedures: none Intrapartum Procedures: spontaneous vaginal delivery Postpartum Procedures: none Complications-Operative and Postpartum: none Hemoglobin  Date Value Ref Range Status  06/24/2016 11.2 (L) 12.0 - 15.0 g/dL Final   HCT  Date Value Ref Range Status  06/24/2016 32.1 (L) 36.0 - 46.0 % Final   Discharge Diagnoses: Term Pregnancy-delivered  Discharge Information: Date: 06/26/2016 Activity: pelvic rest Diet: routine Medications: Ibuprofen Condition: stable Instructions: refer to practice specific booklet Discharge to: home Follow-up Information    Levi AlandANDERSON,MARK E, MD. Call in 4 day(s).   Specialty:  Obstetrics and Gynecology Contact information: [address] [place] [phone]           Newborn Data: Live born female  Birth Weight: 7 lb 10 oz (3459 g) APGAR: 9, 9  Home with mother.  Damontre Millea A 06/26/2016, 7:51 AM

## 2016-06-26 NOTE — Progress Notes (Signed)
Patient is eating, ambulating, voiding.  Pain control is good.  Vitals:   06/24/16 1647 06/25/16 0545 06/25/16 1811 06/26/16 0700  BP: 92/67 103/73 (!) 105/58 103/62  Pulse:  82 63 64  Resp:  18 18 18   Temp:  98.1 F (36.7 C) 98.6 F (37 C) 97.6 F (36.4 C)  TempSrc:  Oral Oral Oral  SpO2:      Weight:      Height:        Fundus firm Perineum without swelling.  Lab Results  Component Value Date   WBC 9.3 06/24/2016   HGB 11.2 (L) 06/24/2016   HCT 32.1 (L) 06/24/2016   MCV 70.4 (L) 06/24/2016   PLT 209 06/24/2016    --/--/A POS (03/21 0350)/RI  A/P Post partum day 2.  Routine care.  Expect d/c today.    Keaja Reaume A

## 2016-08-17 IMAGING — CT CT ABD-PELV W/ CM
2 of 4 series · 15 of 46 positions shown, 17 images · IV contrast (Omni 300)
Comparison: None.

CLINICAL DATA: Right upper quadrant abdominal pain beginning
yesterday. Abdominal pain and tenderness. Left lower quadrant pain.

EXAM:
CT ABDOMEN AND PELVIS WITH CONTRAST
TECHNIQUE: Multidetector CT imaging of the abdomen and pelvis was performed
using the standard protocol following bolus administration of
intravenous contrast.
CONTRAST:  100 mL Jsovue-6TT

[Series 2: a/p w/ 5mm · axial · 0.65mm/px · z∈[-510,-94]mm · 12 of 91 slices shown, 14 images]
[im 4/91  soft-tissue]
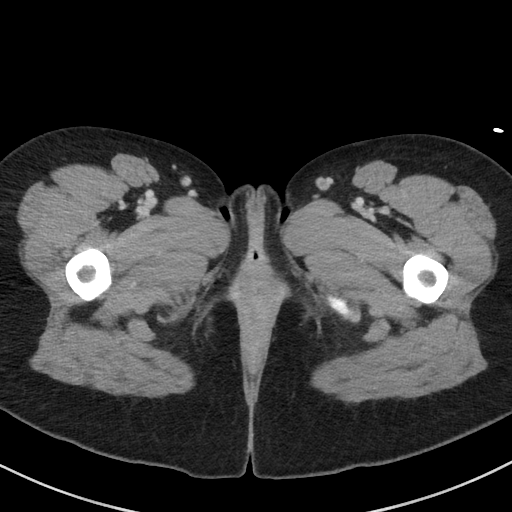
[im 4/91  bone]
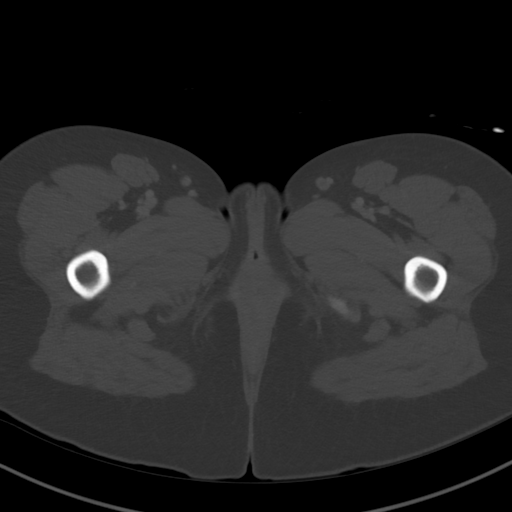
[im 12/91  soft-tissue]
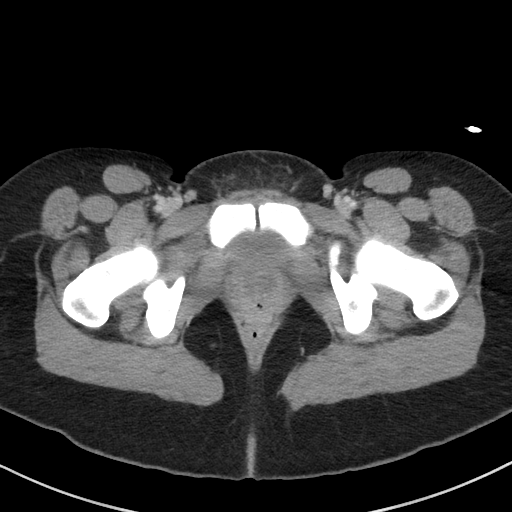
[im 20/91  soft-tissue]
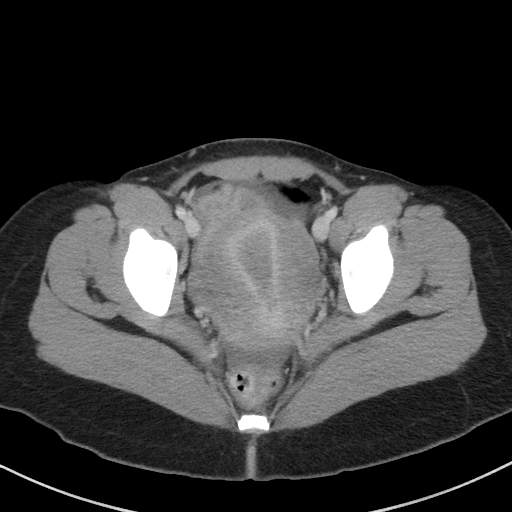
[im 28/91  soft-tissue]
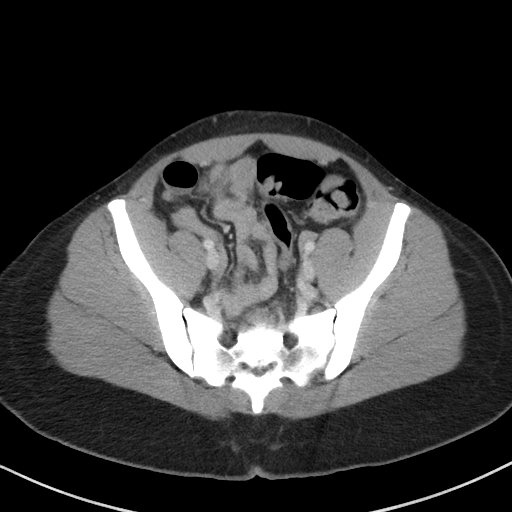
[im 36/91  soft-tissue]
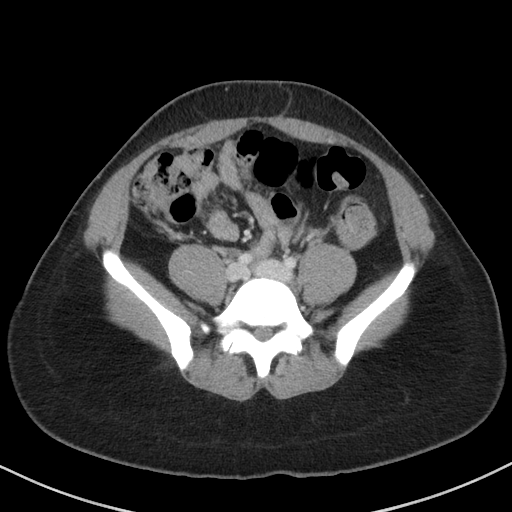
[im 44/91  soft-tissue]
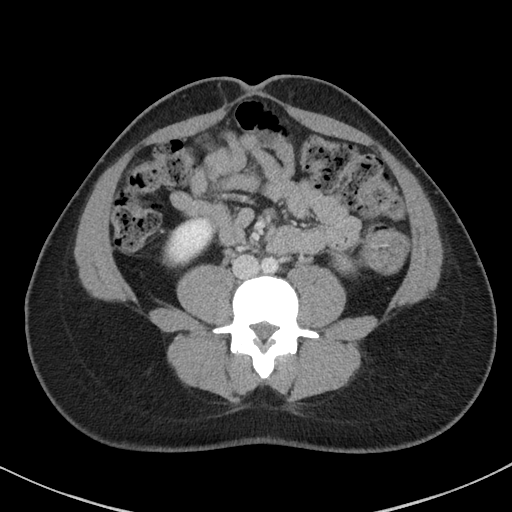
[im 47/91  soft-tissue]
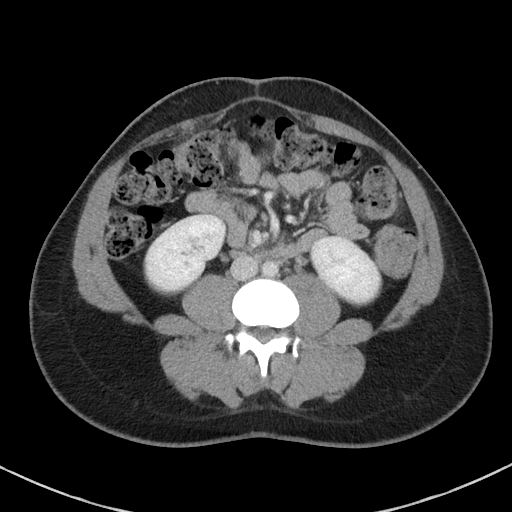
[im 55/91  soft-tissue]
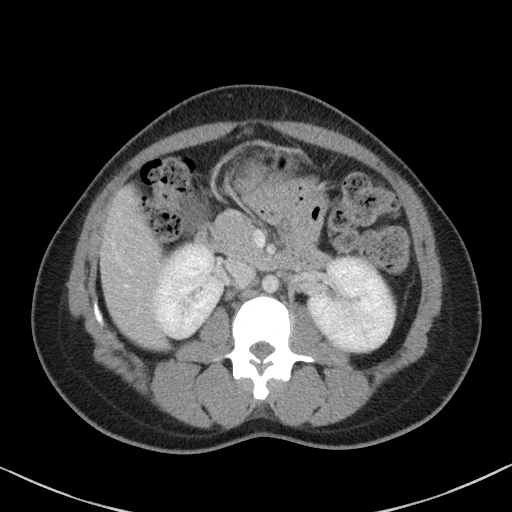
[im 63/91  soft-tissue]
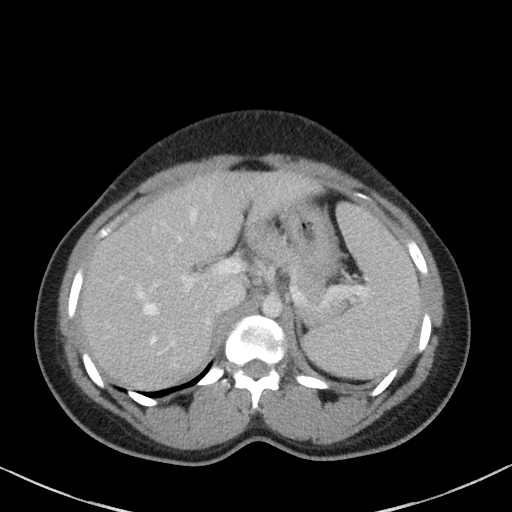
[im 63/91  bone]
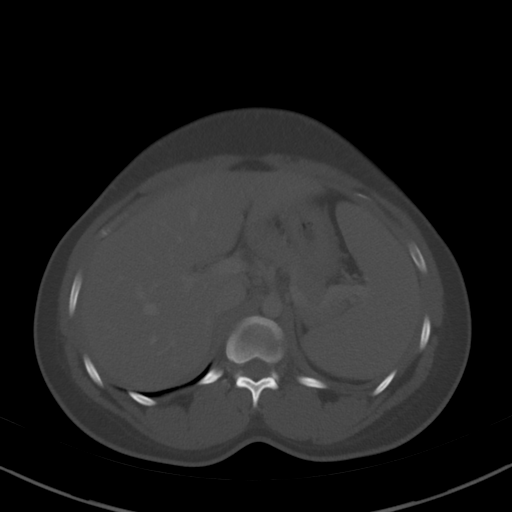
[im 71/91  soft-tissue]
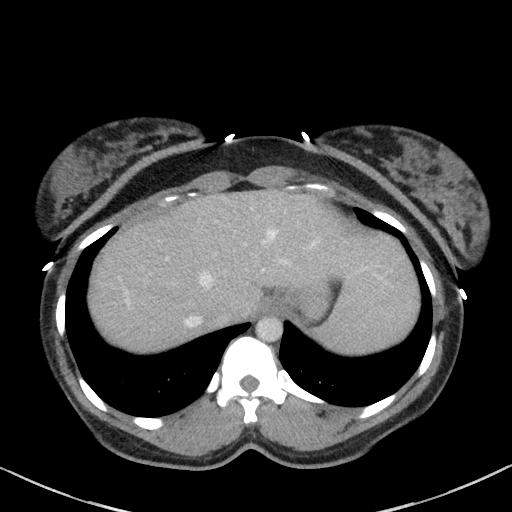
[im 79/91  soft-tissue]
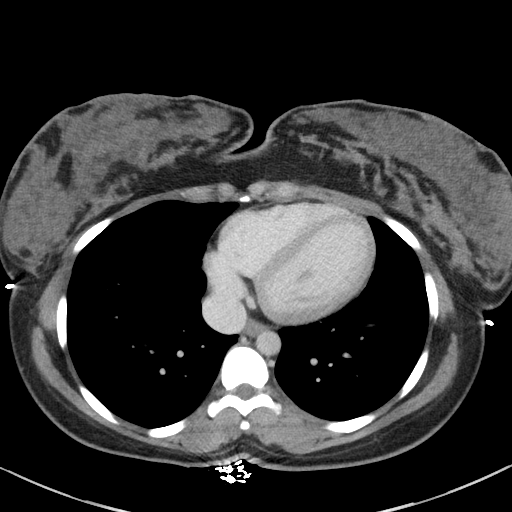
[im 87/91  soft-tissue]
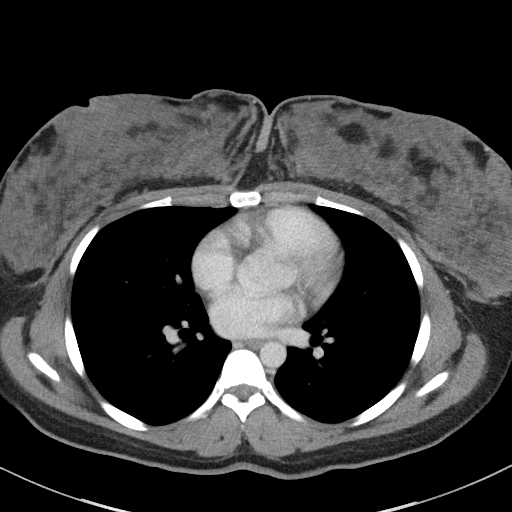

[Series 5: a/p w/ cor · coronal · 0.71mm/px · 3 of 142 slices shown]
[im 48/142  soft-tissue]
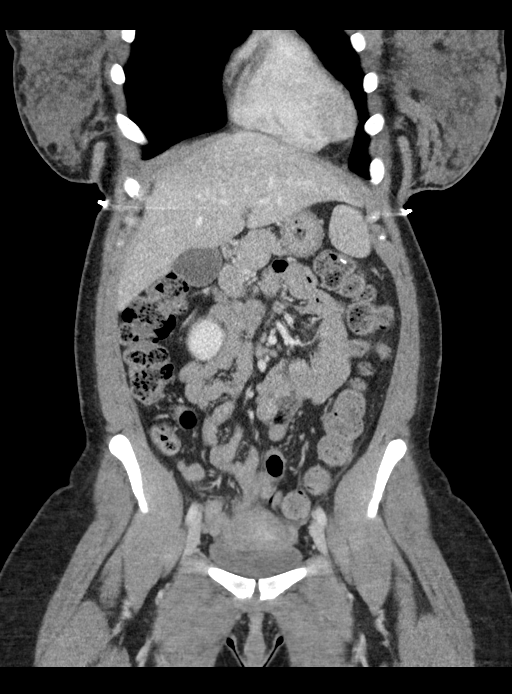
[im 63/142  soft-tissue]
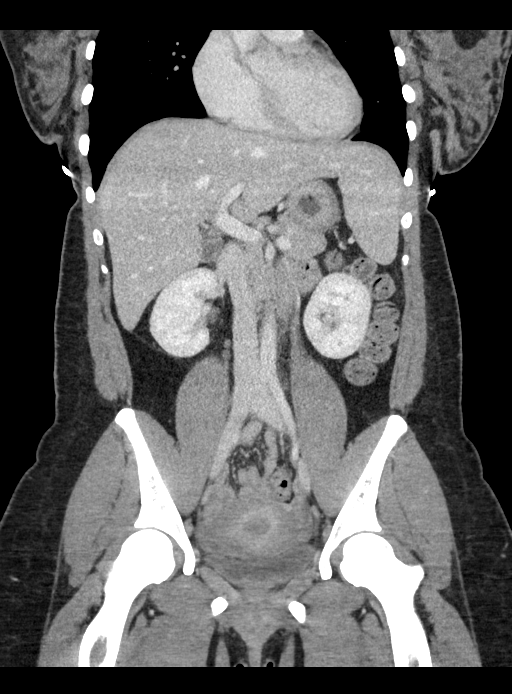
[im 79/142  soft-tissue]
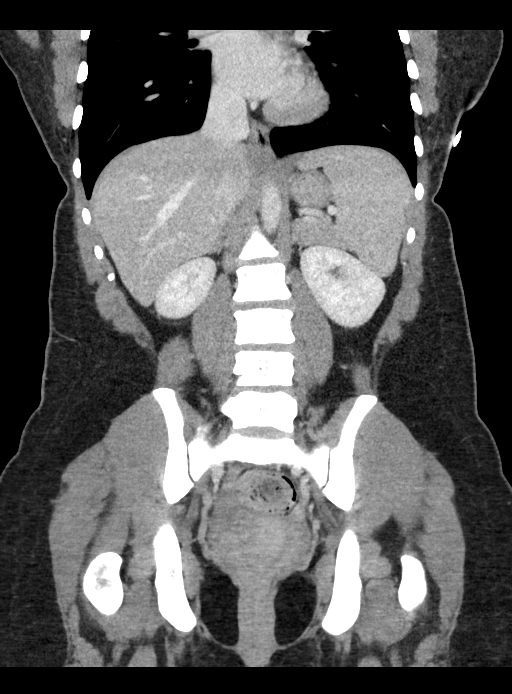

[15 of 46 positions shown; findings below may reference images not displayed]

FINDINGS: Lower chest: The lung bases are clear without focal nodule, mass, or
airspace disease. Heart size is normal. No significant pleural or
pericardial effusion is present.

Hepatobiliary: The liver is within normal limits. The common bile
duct and gallbladder are unremarkable.

Pancreas: Within normal limits.

Spleen: Unremarkable.

Adrenals/Urinary Tract: The adrenal glands are normal bilaterally.
The kidneys and ureters are within normal limits. The urinary
bladder is normal.

Stomach/Bowel: The stomach and duodenum are within normal limits.
The small bowel is within normal limits. The appendix is visualized
and normal. The cecum and ascending colon are normal. The transverse
colon is within normal limits. The descending and rectosigmoid colon
are within normal limits.

Vascular/Lymphatic: No significant adenopathy is present. No focal
vascular lesions are present.

Reproductive: The uterus is moderately edematous. There is fluid
within the endometrial canal. Moderate free fluid surrounds the
uterus and layers dependently within the anatomic pelvis. The adnexa
are within normal limits bilaterally.

Other: A small paraumbilical hernia contains fat but no bowel.

Musculoskeletal: No focal lytic or blastic lesions are present.
Vertebral body heights and alignment are maintained.
IMPRESSION: 1. No acute or focal lesion to explain upper abdominal pain or
vomiting.
2. Moderate edema surrounding the uterus with fluid in the
endometrial canal. This is nonspecific and could be related to the
patient's menstrual cycle. Pelvic infection is not excluded.
3. Layering free fluid within the anatomic pelvis may be
physiologic.

## 2017-01-05 ENCOUNTER — Encounter (HOSPITAL_COMMUNITY): Payer: Self-pay

## 2017-01-05 ENCOUNTER — Inpatient Hospital Stay (HOSPITAL_COMMUNITY)
Admission: AD | Admit: 2017-01-05 | Discharge: 2017-01-06 | Disposition: A | Discharge status: Home / Self Care | Payer: BLUE CROSS/BLUE SHIELD | Source: Ambulatory Visit | Attending: Obstetrics and Gynecology | Admitting: Obstetrics and Gynecology

## 2017-01-05 DIAGNOSIS — N946 Dysmenorrhea, unspecified: Secondary | ICD-10-CM | POA: Diagnosis not present

## 2017-01-05 DIAGNOSIS — Z79899 Other long term (current) drug therapy: Secondary | ICD-10-CM | POA: Insufficient documentation

## 2017-01-05 DIAGNOSIS — N939 Abnormal uterine and vaginal bleeding, unspecified: Secondary | ICD-10-CM | POA: Diagnosis present

## 2017-01-05 LAB — URINALYSIS, MICROSCOPIC (REFLEX): SQUAMOUS EPITHELIAL / LPF: NONE SEEN

## 2017-01-05 LAB — CBC WITH DIFFERENTIAL/PLATELET
Basophils Absolute: 0 10*3/uL (ref 0.0–0.1)
Basophils Relative: 0 %
EOS ABS: 0.1 10*3/uL (ref 0.0–0.7)
Eosinophils Relative: 1 %
HCT: 32.7 % — ABNORMAL LOW (ref 36.0–46.0)
Hemoglobin: 11.8 g/dL — ABNORMAL LOW (ref 12.0–15.0)
LYMPHS ABS: 4.1 10*3/uL — AB (ref 0.7–4.0)
LYMPHS PCT: 49 %
MCH: 27.1 pg (ref 26.0–34.0)
MCHC: 36.1 g/dL — ABNORMAL HIGH (ref 30.0–36.0)
MCV: 75 fL — ABNORMAL LOW (ref 78.0–100.0)
Monocytes Absolute: 0.3 10*3/uL (ref 0.1–1.0)
Monocytes Relative: 3 %
NEUTROS PCT: 47 %
Neutro Abs: 4 10*3/uL (ref 1.7–7.7)
Platelets: 211 10*3/uL (ref 150–400)
RBC: 4.36 MIL/uL (ref 3.87–5.11)
RDW: 13.2 % (ref 11.5–15.5)
WBC: 8.4 10*3/uL (ref 4.0–10.5)

## 2017-01-05 LAB — URINALYSIS, ROUTINE W REFLEX MICROSCOPIC
NITRITE: NEGATIVE
PROTEIN: NEGATIVE mg/dL

## 2017-01-05 LAB — WET PREP, GENITAL
CLUE CELLS WET PREP: NONE SEEN
Sperm: NONE SEEN
Trich, Wet Prep: NONE SEEN
Yeast Wet Prep HPF POC: NONE SEEN

## 2017-01-05 LAB — POCT PREGNANCY, URINE: Preg Test, Ur: NEGATIVE

## 2017-01-05 MED ORDER — KETOROLAC TROMETHAMINE 60 MG/2ML IM SOLN
60.0000 mg | INTRAMUSCULAR | Status: AC
  Administered 2017-01-05: 60 mg via INTRAMUSCULAR
  Filled 2017-01-05: qty 2

## 2017-01-05 MED ORDER — IBUPROFEN 600 MG PO TABS: 600.0000 mg | ORAL_TABLET | Freq: Four times a day (QID) | ORAL | 1 refills | Status: AC | PRN

## 2017-01-05 NOTE — MAU Note (Signed)
States she has been dizzy for a week

## 2017-01-05 NOTE — Discharge Instructions (Signed)
In late 2019, the [facility] will be moving to the [place] campus. At that time, the MAU will no longer serve non-pregnant patients. We encourage you to establish care with a provider before that time, so that you can be seen with any GYN concerns, like vaginal discharge, urinary tract infection, etc.. in a timely manner. In order to make the office visit more convenient, the [facility] will be offering evening hours from 4pm-8pm on Mondays starting 12/14/16. There will be same-day appointments, walk-in appointments and scheduled appointments available during this time.   ° °[facility] - [phone] ° °For urgent needs, [facility] is also available for management of urgent GYN complaints such as vaginal discharge.  ° °[employer] extends eligibility for family planning services to reduce unintended pregnancies and improve the well-being of children and families. ° ° Eligible individuals whose income is at or below 195% of the federal poverty level and who are:  °- [place] citizens, documented immigrants or qualified aliens;  °- Residents of [place];  °- Not incarcerated; and  °- Not pregnant.  ° °[employer] Contact Information:  °Medical Assistance Clinical Section °Phone: [phone] °Email: dma.besmart@[facility].[place].gov  ° ° ° °

## 2017-01-05 NOTE — MAU Provider Note (Signed)
History     CSN: [id number]  Arrival date and time: 01/05/17 1947   First Provider Initiated Contact with Patient 01/05/17 2129      Chief Complaint  Patient presents with  . Vaginal Bleeding   HPI  [name] is a 21 y.o. [id number] non-pregnant female presenting to MAU with complaints of VB with clots since last night, vaginal odor, severe lower abdominal cramping, and lower back pain.  She states she last felt like this when she had retained products after having her son. She has had a period since August.  She was seen at [facility] to have a pregnancy blood test, but she does not have the results yet.  She has not taken anything for pain, because she "was not sure what was going on".  Past Medical History:  Diagnosis Date  . Medical history non-contributory     Past Surgical History:  Procedure Laterality Date  . NO PAST SURGERIES      History reviewed. No pertinent family history.  Social History  Substance Use Topics  . Smoking status: Never Smoker  . Smokeless tobacco: Never Used  . Alcohol use No    Allergies:  Allergies  Allergen Reactions  . Kiwi Extract Anaphylaxis and Itching    Prescriptions Prior to Admission  Medication Sig Dispense Refill Last Dose  . omeprazole (PRILOSEC) 20 MG capsule Take 20 mg by mouth daily.   06/03/2016  . Prenatal Vit-Fe Fumarate-FA (PRENATAL MULTIVITAMIN) TABS tablet Take 1 tablet by mouth daily at 12 noon.   06/22/2016    Review of Systems  Constitutional: Negative.   HENT: Negative.   Eyes: Negative.   Respiratory: Negative.   Cardiovascular: Negative.   Gastrointestinal: Positive for abdominal pain.  Endocrine: Negative.   Genitourinary: Positive for pelvic pain and vaginal bleeding.       Vaginal odor  Musculoskeletal: Negative.   Skin: Negative.   Allergic/Immunologic: Negative.   Neurological: Negative.   Hematological: Negative.   Psychiatric/Behavioral: Negative.    Physical Exam   Blood pressure 100/62,  pulse 60, temperature 97.8 F (36.6 C), temperature source Oral, resp. rate 18, SpO2 100 %, unknown if currently breastfeeding.  Physical Exam  Nursing note and vitals reviewed. Constitutional: She is oriented to person, place, and time. She appears well-developed and well-nourished.  HENT:  Head: Normocephalic.  Eyes: Pupils are equal, round, and reactive to light.  Neck: Normal range of motion.  Cardiovascular: Normal rate, regular rhythm and normal heart sounds.   Respiratory: Effort normal and breath sounds normal.  GI: Soft. Bowel sounds are normal. There is tenderness. There is no rebound and no guarding.  Genitourinary:  Genitourinary Comments: Uterus: moderately tender, cx: smooth, pink, no lesions, small amt of dark, red, malodorous, blood, closed/long/firm, ? CMT, no friability, bilateral adnexal tenderness   Musculoskeletal: Normal range of motion.  Neurological: She is alert and oriented to person, place, and time.  Skin: Skin is warm and dry.  Psychiatric: She has a normal mood and affect. Her behavior is normal. Judgment and thought content normal.    MAU Course  Procedures  MDM CCUA UPT Wet Prep GC/CT Toradol 60 mg IM --   Results for orders placed or performed during the hospital encounter of 01/05/17 (from the past 24 hour(s))  Urinalysis, Routine w reflex microscopic     Status: Abnormal   Collection Time: 01/05/17  8:23 PM  Result Value Ref Range   Color, Urine RED (A) YELLOW   APPearance TURBID (A)  CLEAR   Specific Gravity, Urine  1.005 - 1.030    TEST NOT REPORTED DUE TO COLOR INTERFERENCE OF URINE PIGMENT   pH  5.0 - 8.0    TEST NOT REPORTED DUE TO COLOR INTERFERENCE OF URINE PIGMENT   Glucose, UA (A) NEGATIVE mg/dL    TEST NOT REPORTED DUE TO COLOR INTERFERENCE OF URINE PIGMENT   Hgb urine dipstick (A) NEGATIVE    TEST NOT REPORTED DUE TO COLOR INTERFERENCE OF URINE PIGMENT   Bilirubin Urine (A) NEGATIVE    TEST NOT REPORTED DUE TO COLOR  INTERFERENCE OF URINE PIGMENT   Ketones, ur (A) NEGATIVE mg/dL    TEST NOT REPORTED DUE TO COLOR INTERFERENCE OF URINE PIGMENT   Protein, ur NEGATIVE NEGATIVE mg/dL   Nitrite NEGATIVE NEGATIVE   Leukocytes, UA (A) NEGATIVE    TEST NOT REPORTED DUE TO COLOR INTERFERENCE OF URINE PIGMENT  Urinalysis, Microscopic (reflex)     Status: Abnormal   Collection Time: 01/05/17  8:23 PM  Result Value Ref Range   RBC / HPF TOO NUMEROUS TO COUNT 0 - 5 RBC/hpf   WBC, UA 6-30 0 - 5 WBC/hpf   Bacteria, UA FEW (A) NONE SEEN   Squamous Epithelial / LPF NONE SEEN NONE SEEN   Urine-Other UNSPUN   Pregnancy, urine POC     Status: None   Collection Time: 01/05/17  8:47 PM  Result Value Ref Range   Preg Test, Ur NEGATIVE NEGATIVE  Wet prep, genital     Status: Abnormal   Collection Time: 01/05/17  9:37 PM  Result Value Ref Range   Yeast Wet Prep HPF POC NONE SEEN NONE SEEN   Trich, Wet Prep NONE SEEN NONE SEEN   Clue Cells Wet Prep HPF POC NONE SEEN NONE SEEN   WBC, Wet Prep HPF POC FEW (A) NONE SEEN   Sperm NONE SEEN    Report given to and care assumed by Dominique Tanner, CNM @ 2200  Dominique Mora, MSN, CNM 01/05/2017, 9:30 PM   Assessment and Plan   MDM: Consult Dr Henderson Cloud with assessment and findings. Likely dysmenorrhea/AUB related to recent irregular periods.  Rx for ibuprofen 600 mg Q 6 hours PRN pain/bleeding. F/U with Dr Tanner Appl in office.  Return to MAU as needed for emergencies.  A: 1. Abnormal uterine bleeding (AUB)   2. Dysmenorrhea     P: D/C home with bleeding precautions   Dominique Tanner, CNM 11:59 PM

## 2017-01-05 NOTE — MAU Note (Signed)
Pt started having vaginal bleeding yesterday night around 1900.  Started passing quarter sized clots today but they are smaller now.  Unknown LMP but she said probably august.  Back pain and lower abdominal pain as well that started yesterday with the bleeding.

## 2017-01-06 LAB — GC/CHLAMYDIA PROBE AMP (~~LOC~~) NOT AT ARMC
Chlamydia: NEGATIVE
NEISSERIA GONORRHEA: NEGATIVE

## 2017-02-28 ENCOUNTER — Other Ambulatory Visit: Payer: Self-pay

## 2017-02-28 ENCOUNTER — Emergency Department (HOSPITAL_COMMUNITY)
Admission: EM | Admit: 2017-02-28 | Discharge: 2017-02-28 | Disposition: A | Discharge status: Home / Self Care | Payer: BLUE CROSS/BLUE SHIELD | Attending: Emergency Medicine | Admitting: Emergency Medicine

## 2017-02-28 ENCOUNTER — Encounter (HOSPITAL_COMMUNITY): Payer: Self-pay | Admitting: Emergency Medicine

## 2017-02-28 DIAGNOSIS — J029 Acute pharyngitis, unspecified: Secondary | ICD-10-CM | POA: Diagnosis present

## 2017-02-28 DIAGNOSIS — H9202 Otalgia, left ear: Secondary | ICD-10-CM | POA: Diagnosis not present

## 2017-02-28 DIAGNOSIS — Z79899 Other long term (current) drug therapy: Secondary | ICD-10-CM | POA: Diagnosis not present

## 2017-02-28 MED ORDER — FLUTICASONE PROPIONATE 50 MCG/ACT NA SUSP: 1.0000 | Freq: Every day | NASAL | 2 refills | Status: AC

## 2017-02-28 MED ORDER — LIDOCAINE VISCOUS 2 % MT SOLN: 15.0000 mL | OROMUCOSAL | 0 refills | Status: AC | PRN

## 2017-02-28 NOTE — ED Provider Notes (Signed)
[facility] EMERGENCY DEPARTMENT Provider Note   CSN: [id number] Arrival date & time: 02/28/17  1643     History   Chief Complaint Chief Complaint  Patient presents with  . Otalgia  . Sore Throat    HPI Dominique Tanner is a 21 y.o. female who presents to the emergency department with a chief complaint of constant left ear pain that radiates down the left jaw that began 3-4 days ago with associated sore throat that began today.  She reports that her left ear discomfort initially began as tinnitus and feeling as if her left ear needed to "pop" prior to the onset of pain. She denies fever, chills, congestion, rhinorrhea, sneezing, cough, shortness of breath, N/V, dizziness, neck pain or stiffness, dental pain, or myalgias.  No treatment prior to arrival.  No sick contacts.  The history is provided by the patient. No language interpreter was used.  Otalgia  The current episode started more than 2 days ago. There is pain in the left ear. The problem occurs constantly. The problem has not changed since onset.There has been no fever. Associated symptoms include sore throat. Pertinent negatives include no ear discharge, no hearing loss, no rhinorrhea, no abdominal pain, no vomiting, no neck pain, no cough and no rash. Associated symptoms comments: tinnitus. Her past medical history does not include chronic ear infection, hearing loss or tympanostomy tube.  Sore Throat  Pertinent negatives include no abdominal pain.    Past Medical History:  Diagnosis Date  . Medical history non-contributory     Patient Active Problem List   Diagnosis Date Noted  . Normal labor and delivery 06/24/2016  . Pregnancy 06/24/2016  . Threatened preterm labor, third trimester 05/08/2016    Past Surgical History:  Procedure Laterality Date  . NO PAST SURGERIES      OB History    Gravida Para Term Preterm AB Living   2 2 2     2    SAB TAB Ectopic Multiple Live Births         0 2        Home Medications    Prior to Admission medications   Medication Sig Start Date End Date Taking? Authorizing Provider  fluticasone (FLONASE) 50 MCG/ACT nasal spray Place 1 spray into both nostrils daily. 02/28/17   Faylinn Schwenn A, PA-C  ibuprofen (ADVIL,MOTRIN) 600 MG tablet Take 1 tablet (600 mg total) by mouth every 6 (six) hours as needed. 01/05/17   Leftwich-Kirby, Wilmer Floor, CNM  lidocaine (XYLOCAINE) 2 % solution Use as directed 15 mLs in the mouth or throat as needed for mouth pain. 02/28/17   Rylei Codispoti A, PA-C  omeprazole (PRILOSEC) 20 MG capsule Take 20 mg by mouth daily.    [provider]  Prenatal Vit-Fe Fumarate-FA (PRENATAL MULTIVITAMIN) TABS tablet Take 1 tablet by mouth daily at 12 noon.    [provider]    Family History No family history on file.  Social History Social History   Tobacco Use  . Smoking status: Never Smoker  . Smokeless tobacco: Never Used  Substance Use Topics  . Alcohol use: No  . Drug use: No     Allergies   Kiwi extract   Review of Systems Review of Systems  Constitutional: Negative for chills and fever.  HENT: Positive for ear pain, sore throat and tinnitus (resolved). Negative for congestion, dental problem, drooling, ear discharge, hearing loss, postnasal drip, rhinorrhea, sinus pressure, sinus pain, sneezing and trouble swallowing.  Eyes: Negative for visual disturbance.  Respiratory: Negative for cough.   Gastrointestinal: Negative for abdominal pain, nausea and vomiting.  Musculoskeletal: Negative for neck pain.  Skin: Negative for rash.  Allergic/Immunologic: Negative for immunocompromised state.  Neurological: Negative for dizziness and light-headedness.     Physical Exam Updated Vital Signs BP 105/62 (BP Location: Right Arm)   Pulse 96   Temp 98.3 F (36.8 C) (Oral)   Resp 16   Ht 5\' 1"  (1.549 m)   Wt 71.7 kg (158 lb)   LMP 02/08/2017   SpO2 99%   BMI 29.85 kg/m   Physical Exam   Constitutional: No distress.  HENT:  Head: Normocephalic and atraumatic.  Right Ear: Hearing normal. No drainage, swelling or tenderness. No middle ear effusion.  Left Ear: Hearing normal. No swelling or tenderness.  Mouth/Throat: Uvula is midline and mucous membranes are normal. No oral lesions. No uvula swelling. Posterior oropharyngeal erythema present. No oropharyngeal exudate, posterior oropharyngeal edema or tonsillar abscesses. No tonsillar exudate.  Minimal erythema noted in the left ear canal.  No edema.  No tenderness to palpation.  No pain with movement of the tragus or auricle.  No tenderness to palpation of the teeth.  No gross abscess.  No mastoid tenderness bilaterally.  No tenderness to palpation of the bilateral frontal or maxillary sinuses.  Eyes: Conjunctivae are normal.  Neck: Normal range of motion. Neck supple.  No meningeal signs.  Cardiovascular: Normal rate, regular rhythm and normal heart sounds. Exam reveals no gallop and no friction rub.  No murmur heard. Pulmonary/Chest: Effort normal and breath sounds normal. No stridor. No respiratory distress. She has no wheezes. She has no rales. She exhibits no tenderness.  Abdominal: Soft. She exhibits no distension.  Lymphadenopathy:    She has no cervical adenopathy.  Neurological: She is alert.  Skin: Skin is warm. No rash noted.  Psychiatric: Her behavior is normal.  Nursing note and vitals reviewed.    ED Treatments / Results  Labs (all labs ordered are listed, but only abnormal results are displayed) Labs Reviewed - No data to display  EKG  EKG Interpretation None       Radiology No results found.  Procedures Procedures (including critical care time)  Medications Ordered in ED Medications - No data to display   Initial Impression / Assessment and Plan / ED Course  I have reviewed the triage vital signs and the nursing notes.  Pertinent labs & imaging results that were available during my care  of the patient were reviewed by me and considered in my medical decision making (see chart for details).    21 year old female presenting with 3 days of left otalgia and sore throat for 1 day.  No other associated symptoms.  On physical exam, minimal erythema is noted in the left ear canal; left tympanic membrane is unremarkable.  Minimal erythema is noted in the posterior oropharynx.  Her exam is otherwise unremarkable.  The patient is mostly concerned because she failed a hearing test to get into the [employer] last week and thinks it may be due to her symptoms.  Her gross hearing is intact bilaterally.  We will discharge the patient with viscous lidocaine for her sore throat and sinus rinse and Flonase to help with possible eustachian tube dysfunction.  Strict return precautions given.  No acute distress.  Patient is safe for discharge at this time.   Final Clinical Impressions(s) / ED Diagnoses   Final diagnoses:  Viral pharyngitis  ED Discharge Orders        Ordered    lidocaine (XYLOCAINE) 2 % solution  As needed     02/28/17 1812    fluticasone (FLONASE) 50 MCG/ACT nasal spray  Daily     02/28/17 1812       Barkley BoardsMcDonald, Adine Heimann A, PA-C 02/28/17 1816    Loren RacerYelverton, David, MD 03/01/17 1715

## 2017-02-28 NOTE — ED Notes (Signed)
Pt called in waiting room x1, no response 

## 2017-02-28 NOTE — Discharge Instructions (Signed)
Take 15 mL of viscous lidocaine and swallowed every 3 hours as needed for sore throat.  You can take 1 spray of Flonase in each nostril daily to see if it will improve your ear discomfort.  Instructions on how to perform a sinus rinse are also improved.  Sometimes when you improve the discomfort in the eustachian tube via the nose you can improve your ear symptoms.  I have attached the name of an audiologist above if you need to have your hearing checked.   If you develop new or worsening symptoms, including difficulty breathing, drooling, a high fever or other concerning symptoms, please return to the emergency department for reevaluation.

## 2017-02-28 NOTE — ED Triage Notes (Signed)
C/o pain under L ear that now radiates around to throat x 2-3 days.

## 2017-02-28 NOTE — ED Notes (Signed)
Declined W/C at D/C and was escorted to lobby by RN. 

## 2017-04-19 ENCOUNTER — Emergency Department (HOSPITAL_COMMUNITY)
Admission: EM | Admit: 2017-04-19 | Discharge: 2017-04-19 | Disposition: A | Discharge status: Home / Self Care | Payer: BLUE CROSS/BLUE SHIELD | Attending: Emergency Medicine | Admitting: Emergency Medicine

## 2017-04-19 ENCOUNTER — Encounter (HOSPITAL_COMMUNITY): Payer: Self-pay | Admitting: Emergency Medicine

## 2017-04-19 ENCOUNTER — Other Ambulatory Visit: Payer: Self-pay

## 2017-04-19 DIAGNOSIS — H6001 Abscess of right external ear: Secondary | ICD-10-CM | POA: Diagnosis not present

## 2017-04-19 DIAGNOSIS — Z79899 Other long term (current) drug therapy: Secondary | ICD-10-CM | POA: Insufficient documentation

## 2017-04-19 DIAGNOSIS — H9201 Otalgia, right ear: Secondary | ICD-10-CM | POA: Diagnosis present

## 2017-04-19 MED ORDER — SULFAMETHOXAZOLE-TRIMETHOPRIM 800-160 MG PO TABS: 1.0000 | ORAL_TABLET | Freq: Two times a day (BID) | ORAL | 0 refills | Status: AC

## 2017-04-19 MED ORDER — LIDOCAINE HCL 2 % IJ SOLN
10.0000 mL | Freq: Once | INTRAMUSCULAR | Status: AC
  Administered 2017-04-19: 200 mg
  Filled 2017-04-19: qty 20

## 2017-04-19 NOTE — ED Notes (Signed)
ED Provider at bedside. 

## 2017-04-19 NOTE — ED Provider Notes (Signed)
[facility]   CSN: [id number] Arrival date & time: 04/19/17  1944     History   Chief Complaint Chief Complaint  Patient presents with  . Otalgia    HPI Dominique Tanner is a 22 y.o. female.  The history is provided by the patient and medical records. No language interpreter was used.   Dominique Tanner is a 22 y.o. female with no known PMH who presents to ED for progressively worsening right ear pain x 2 days. She initially felt like she had a small pimple to the inside of her ear. She now has throbbing pain to the site and feels as if the pimple is getting bigger. No drainage from the site. No fever, chills, cough, congestion. She has taken no medications prior to arrival for symptoms. Pain worse with palpation. No alleviating factors noted.  Past Medical History:  Diagnosis Date  . Medical history non-contributory     Patient Active Problem List   Diagnosis Date Noted  . Normal labor and delivery 06/24/2016  . Pregnancy 06/24/2016  . Threatened preterm labor, third trimester 05/08/2016    Past Surgical History:  Procedure Laterality Date  . NO PAST SURGERIES      OB History    Gravida Para Term Preterm AB Living   2 2 2     2    SAB TAB Ectopic Multiple Live Births         0 2       Home Medications    Prior to Admission medications   Medication Sig Start Date End Date Taking? Authorizing Provider  fluticasone (FLONASE) 50 MCG/ACT nasal spray Place 1 spray into both nostrils daily. 02/28/17   McDonald, Mia A, PA-C  ibuprofen (ADVIL,MOTRIN) 600 MG tablet Take 1 tablet (600 mg total) by mouth every 6 (six) hours as needed. 01/05/17   Leftwich-Kirby, Wilmer FloorLisa A, CNM  lidocaine (XYLOCAINE) 2 % solution Use as directed 15 mLs in the mouth or throat as needed for mouth pain. 02/28/17   McDonald, Mia A, PA-C  omeprazole (PRILOSEC) 20 MG capsule Take 20 mg by mouth daily.    [provider]  Prenatal Vit-Fe Fumarate-FA  (PRENATAL MULTIVITAMIN) TABS tablet Take 1 tablet by mouth daily at 12 noon.    [provider]  sulfamethoxazole-trimethoprim (BACTRIM DS,SEPTRA DS) 800-160 MG tablet Take 1 tablet by mouth 2 (two) times daily for 7 days. 04/19/17 04/26/17  Estelle Greenleaf, Chase PicketJaime Pilcher, PA-C    Family History History reviewed. No pertinent family history.  Social History Social History   Tobacco Use  . Smoking status: Never Smoker  . Smokeless tobacco: Never Used  Substance Use Topics  . Alcohol use: No  . Drug use: No     Allergies   Kiwi extract   Review of Systems Review of Systems  Constitutional: Negative for chills and fever.  HENT: Positive for ear pain. Negative for congestion, ear discharge, sore throat and trouble swallowing.   Respiratory: Negative for cough and shortness of breath.   Cardiovascular: Negative for chest pain.  Gastrointestinal: Negative for abdominal pain.     Physical Exam Updated Vital Signs BP 95/65 (BP Location: Right Arm)   Pulse 78   Temp 98.4 F (36.9 C) (Oral)   Resp 16   Ht 5\' 1"  (1.549 m)   Wt 68 kg (150 lb)   LMP 04/09/2017 (Exact Date)   SpO2 100%   BMI 28.34 kg/m   Physical Exam  Constitutional: She  appears well-developed and well-nourished. No distress.  HENT:  Head: Normocephalic and atraumatic.  Left ear normal. Right ear with 0.5 cm tender area of fluctuance behind the tragus. TM normal.   Neck: Neck supple.  Cardiovascular: Normal rate, regular rhythm and normal heart sounds.  No murmur heard. Pulmonary/Chest: Effort normal and breath sounds normal. No respiratory distress. She has no wheezes. She has no rales.  Musculoskeletal: Normal range of motion.  Neurological: She is alert.  Skin: Skin is warm and dry.  Nursing note and vitals reviewed.    ED Treatments / Results  Labs (all labs ordered are listed, but only abnormal results are displayed) Labs Reviewed - No data to display  EKG  EKG Interpretation None        Radiology No results found.  Procedures .Marland KitchenIncision and Drainage Date/Time: 04/19/2017 9:36 PM Performed by: Anay Rathe, Chase Picket, PA-C Authorized by: Quenna Doepke, Chase Picket, PA-C   Consent:    Consent obtained:  Verbal   Consent given by:  Patient   Risks discussed:  Bleeding, incomplete drainage, pain and infection Location:    Type:  Abscess   Size:  0.5 cm   Location:  Head   Head location:  R external auditory canal Anesthesia (see MAR for exact dosages):    Anesthesia method:  Local infiltration   Local anesthetic:  Lidocaine 2% w/o epi Procedure type:    Complexity:  Simple Procedure details:    Incision types:  Stab incision   Scalpel blade:  11   Drainage:  Purulent   Drainage amount:  Moderate   Wound treatment:  Wound left open   Packing materials:  None Post-procedure details:    Patient tolerance of procedure:  Tolerated well, no immediate complications   (including critical care time)  Medications Ordered in ED Medications  lidocaine (XYLOCAINE) 2 % (with pres) injection 200 mg (200 mg Infiltration Given 04/19/17 2052)     Initial Impression / Assessment and Plan / ED Course  I have reviewed the triage vital signs and the nursing notes.  Pertinent labs & imaging results that were available during my care of the patient were reviewed by me and considered in my medical decision making (see chart for details).    Dominique Tanner is a 22 y.o. female who presents to ED for very small abscess just behind tragus of the right ear.  I&D performed per procedure note above. Patient tolerated the procedure well.  No Patient was prescribed bactrim. Wound care instructions discussed.  Return to ER if concern for spread of infection, increasing pain, fevers or other concerns. All questions answered.  Patient seen by and discussed with Dr. Particia Nearing who agrees with treatment plan.   Final Clinical Impressions(s) / ED Diagnoses   Final diagnoses:  Abscess of right ear canal     ED Discharge Orders        Ordered    sulfamethoxazole-trimethoprim (BACTRIM DS,SEPTRA DS) 800-160 MG tablet  2 times daily     04/19/17 2108       Booker Bhatnagar, Chase Picket, PA-C 04/19/17 2203    Jacalyn Lefevre, MD 04/20/17 1620

## 2017-04-19 NOTE — ED Notes (Signed)
Patient Alert and oriented X4. Stable and ambulatory. Patient verbalized understanding of the discharge instructions.  Patient belongings were taken by the patient.  

## 2017-04-19 NOTE — ED Triage Notes (Signed)
Pt reports R ear pain since two days ago, noticed "A pimple" when cleaning them and has since gotten bigger.

## 2017-04-19 NOTE — Discharge Instructions (Signed)
It was my pleasure taking care of you today!   Please take all of your antibiotics until finished!   Follow up with your primary care doctor if symptoms are not improving in the next 3 days.   Return to ER for fever, new or worsening symptoms, any additional concerns.
